# Patient Record
Sex: Female | Born: 1970 | Race: White | Hispanic: No | Smoking: Former smoker
Health system: Southern US, Community
[De-identification: ages and names within clinical notes are randomized; demographics above are authoritative.]

## PROBLEM LIST (undated history)

## (undated) DIAGNOSIS — K900 Celiac disease: Secondary | ICD-10-CM

## (undated) HISTORY — PX: CHOLECYSTECTOMY: SHX55

---

## 1998-05-12 ENCOUNTER — Ambulatory Visit (HOSPITAL_COMMUNITY): Admission: RE | Admit: 1998-05-12 | Discharge: 1998-05-12 | Payer: Self-pay | Admitting: Neurosurgery

## 1998-05-18 ENCOUNTER — Ambulatory Visit (HOSPITAL_COMMUNITY): Admission: RE | Admit: 1998-05-18 | Discharge: 1998-05-18 | Payer: Self-pay | Admitting: Neurosurgery

## 2005-03-22 ENCOUNTER — Emergency Department (HOSPITAL_COMMUNITY): Admission: EM | Admit: 2005-03-22 | Discharge: 2005-03-22 | Payer: Self-pay | Admitting: Emergency Medicine

## 2006-08-23 ENCOUNTER — Inpatient Hospital Stay (HOSPITAL_COMMUNITY): Admission: AD | Admit: 2006-08-23 | Discharge: 2006-08-24 | Payer: Self-pay | Admitting: Obstetrics & Gynecology

## 2006-08-23 ENCOUNTER — Ambulatory Visit: Payer: Self-pay | Admitting: Certified Nurse Midwife

## 2006-08-23 IMAGING — US US OB COMP +14 WK
1 series · 13 of 28 positions shown · non-contrast
Comparison: none

CLINICAL DATA: 35-year-old female with estimated gestation of 33 weeks 6 days by LMP.  No prenatal care and pelvic pain.

[Series 1: us ob comp +14 wk · 0.33mm/px · 13 of 35 slices shown]
[im 2/35]
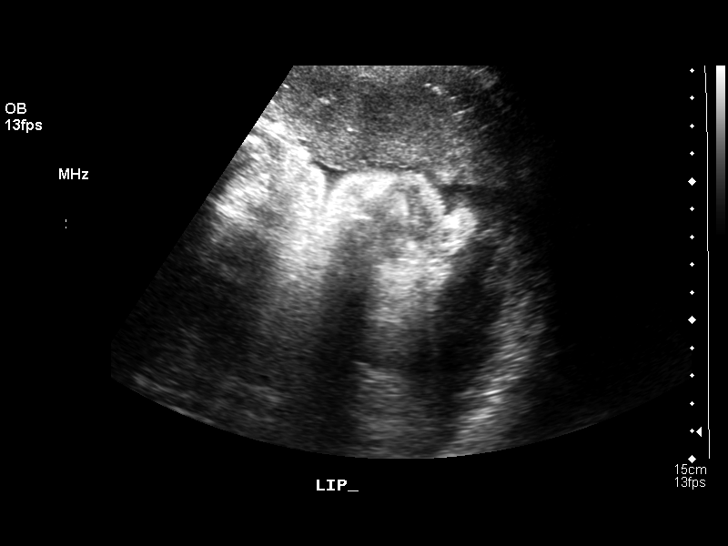
[im 4/35]
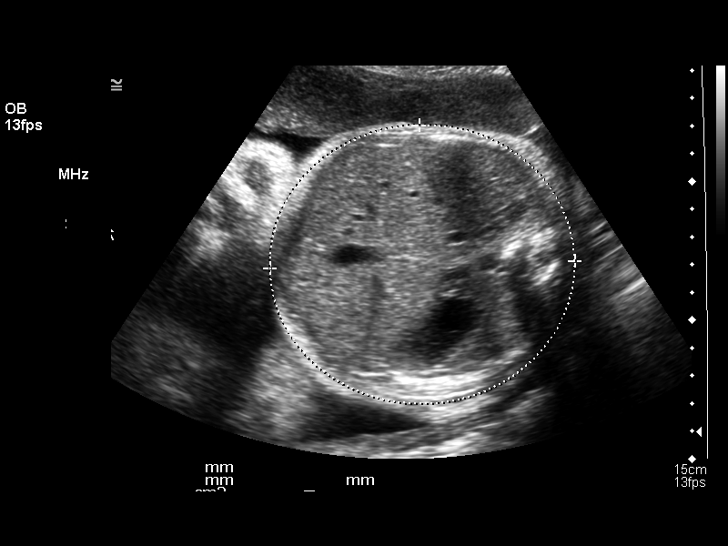
[im 7/35]
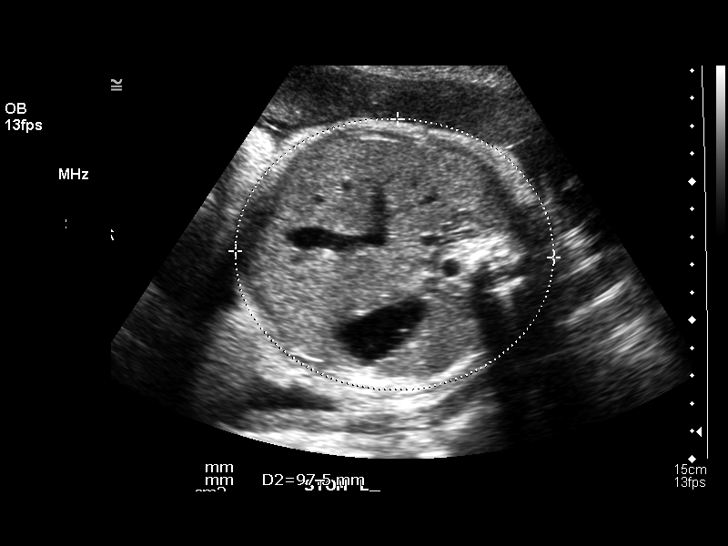
[im 9/35]
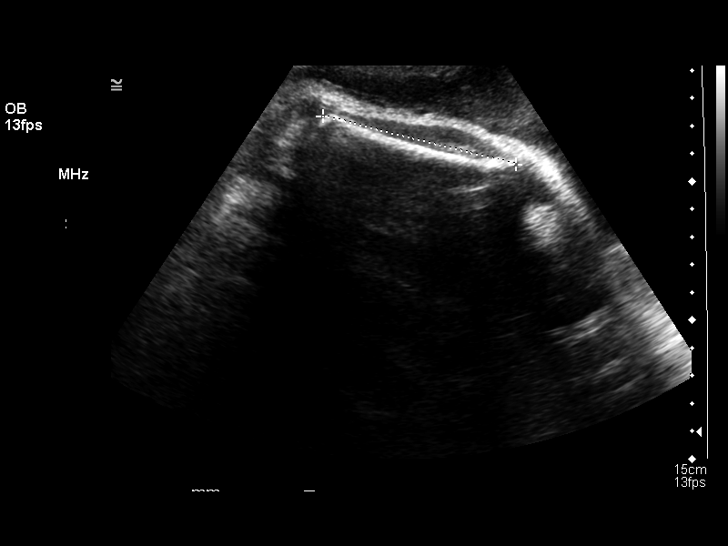
[im 12/35]
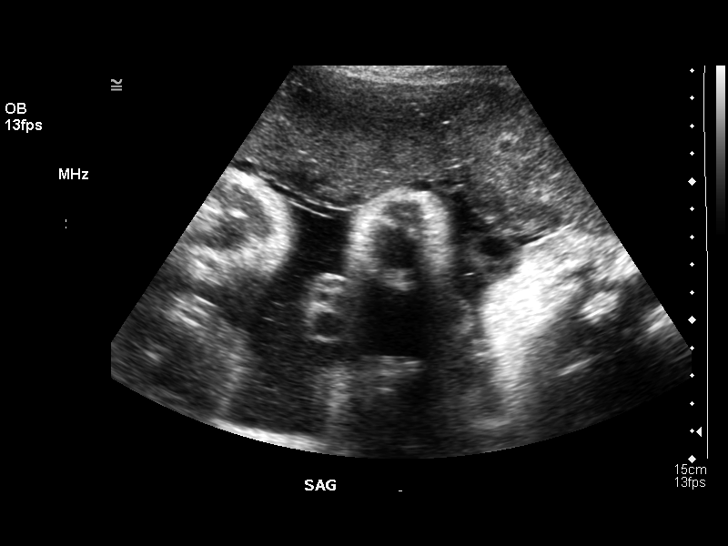
[im 14/35]
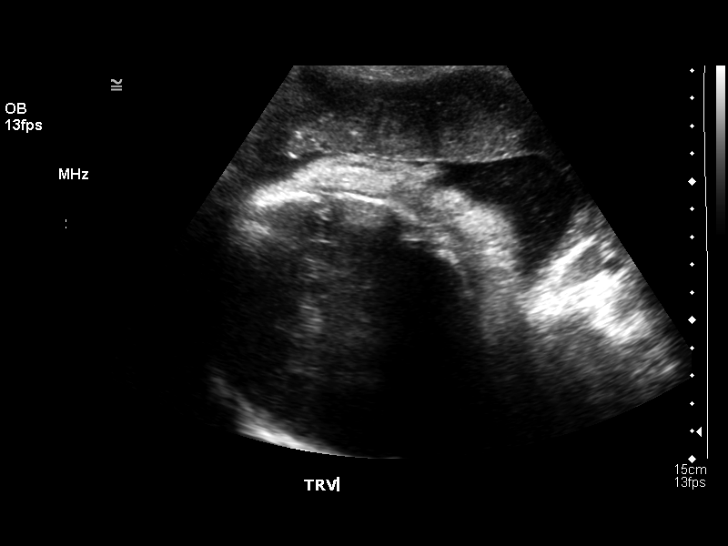
[im 18/35]
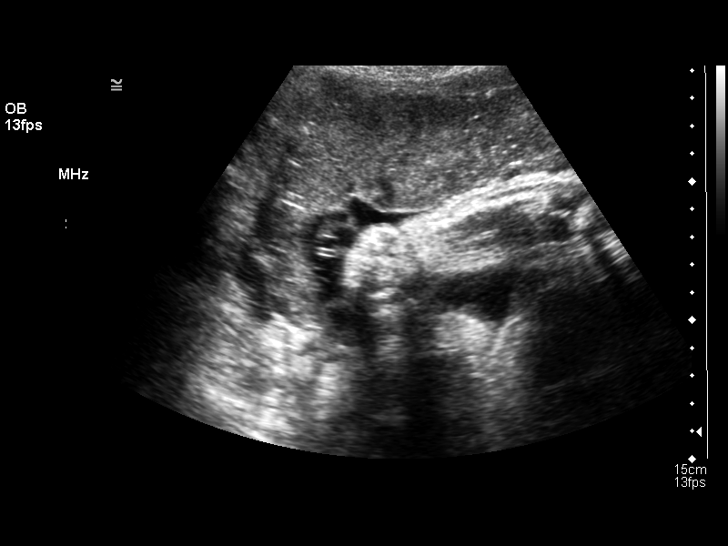
[im 21/35]
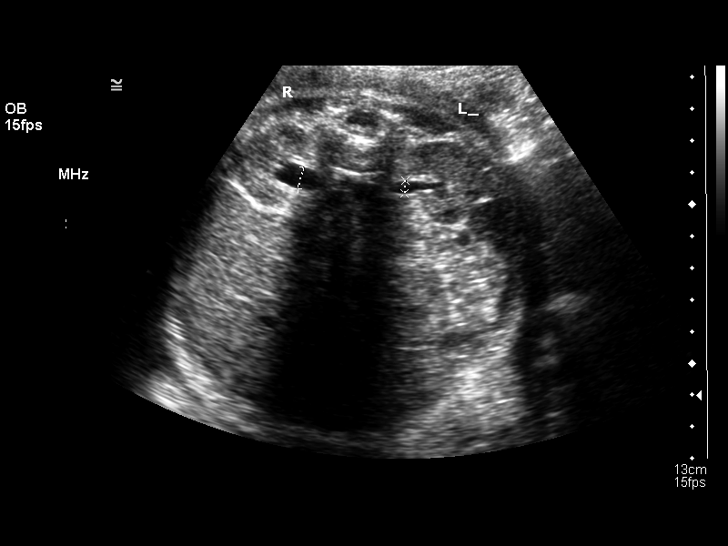
[im 23/35]
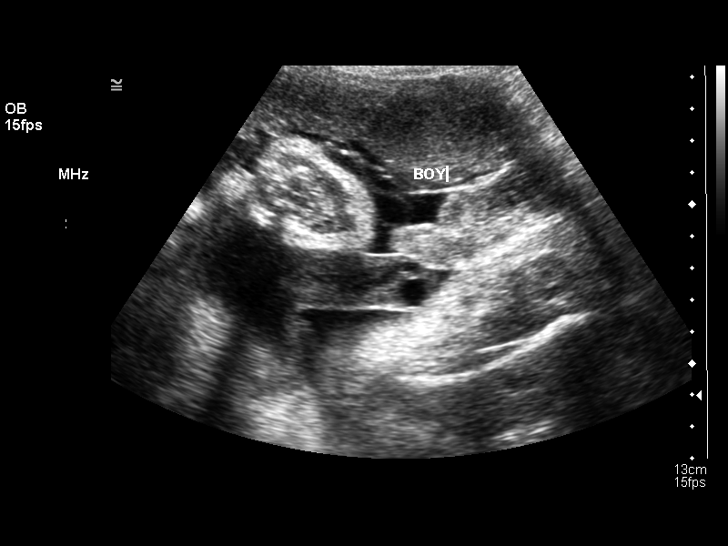
[im 26/35]
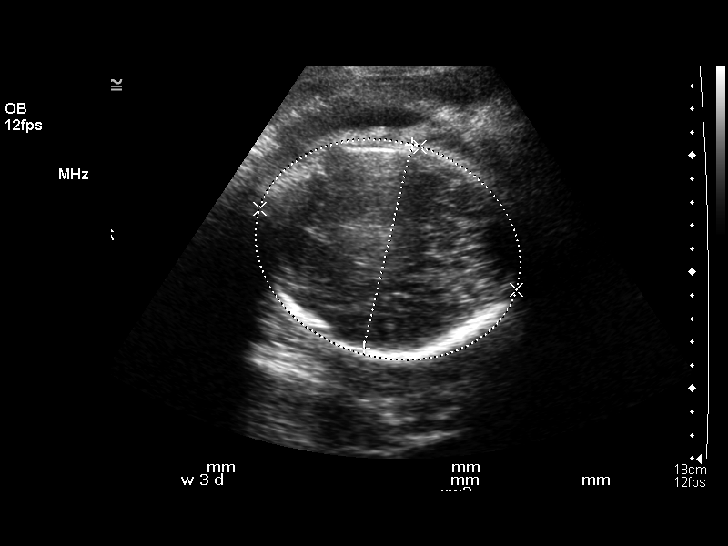
[im 28/35]
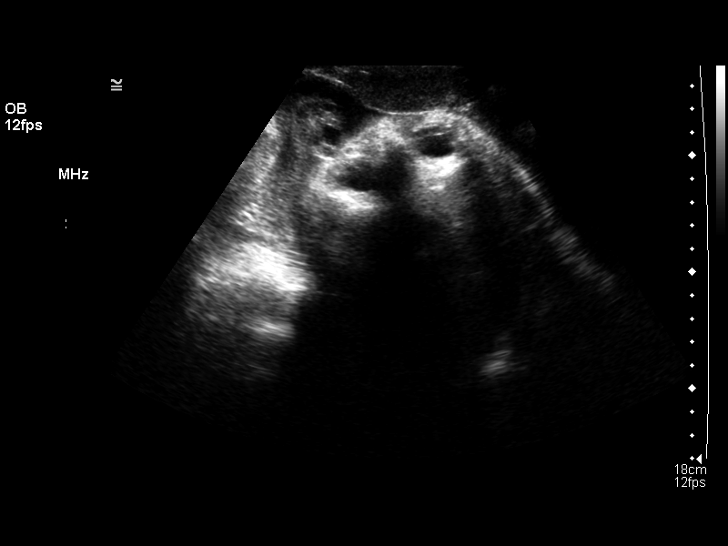
[im 31/35]
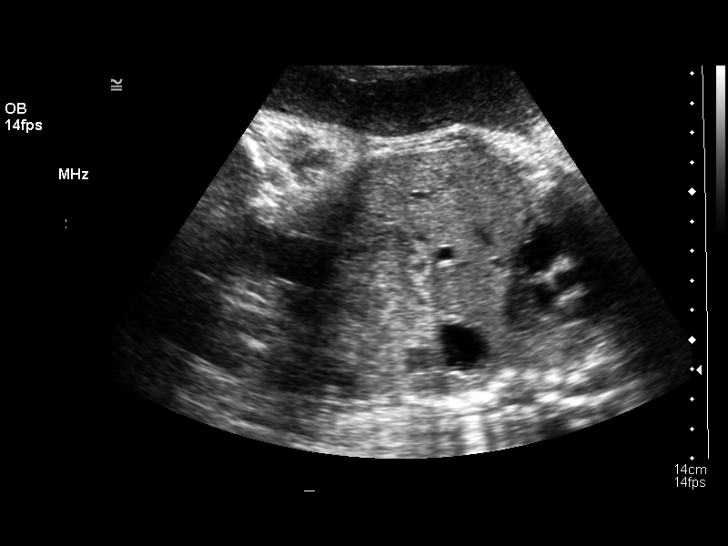
[im 33/35]
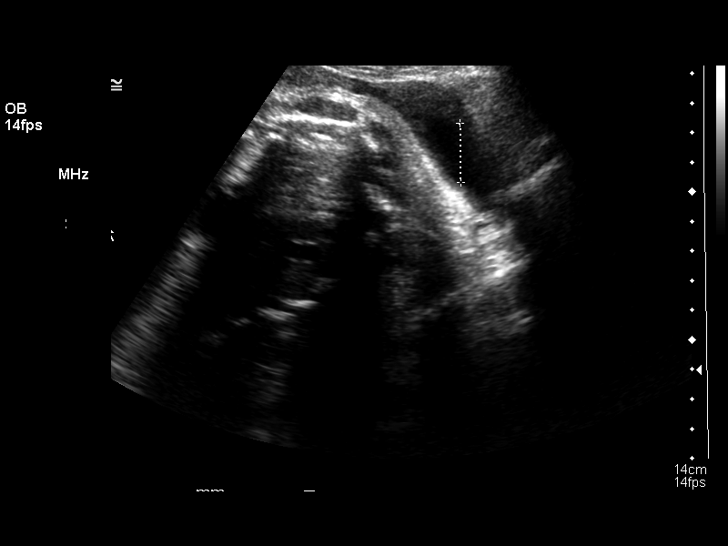

[13 of 28 positions shown; findings below may reference images not displayed]

OBSTETRICAL ULTRASOUND:
 Number of Fetuses:  1
 Heart Rate:  141 bpm
 Movement:  Yes
 Breathing:  No     
 Presentation:  Cephalic
 Placental Location:  Anterior
 Grade:  II
 Previa:  No
 Amniotic Fluid (Subjective):  Normal
 Amniotic Fluid (Objective):   AFI 11.4 cm ([6Q] %ile = 7.5 to 24.4 cm for 37 weeks) 

 FETAL BIOMETRY
 BPD:   9.0 cm    36 w 3 d
 HC:   32.3 cm  37 w 1 d
 AC:  33.5 cm  37 w 3 d
 FL:  7.1 cm  36 w 4 d 

 MEAN GA:  36 w 6 d      US EDC:  [DATE]
 Assigned GA:  33 w 6 d    Assigned EDC: [DATE]

 EFW:  [6Q]   grams 50th 75th %ile ([6Q] ? [6Q] g) for 37 weeks ? ( >95th %ile (95th = [6Q] g) for 34 weeks

 FETAL ANATOMY
 Lateral Ventricles:  Not visualized   
 Thalami/CSP:  Not visualized   
 Posterior Fossa:  Not visualized   
 Nuchal Region:  Not visualized 
 Spine:  Not visualized   
 4 Chamber Heart on Left:  Not visualized   
 Stomach on Left:  Visualized   
 3 Vessel Cord:  Visualized 
 Cord Insertion site:  Not visualized 
 Kidneys:  Visualized   
 Bladder:  Visualized   
 Extremities:  Not visualized   

 ADDITIONAL ANATOMY VISUALIZED:  Upper lip, orbits, and diaphragm.

 Evaluation limited by:  Fetal position and advanced gestational age.

 MATERNAL UTERINE AND ADNEXAL FINDINGS
 Cervix: Not evaluated; >34 weeks.
IMPRESSION: 1.   Single living intrauterine gestation in cephalic presentation with estimated gestational age by this ultrasound of 36 weeks 6 days.
 2.  Right renal pelvis measuring 7 mm and left renal pelvis 4 mm.  Follow-up recommended.
 3.  Normal amniotic fluid volume.

## 2006-08-27 ENCOUNTER — Inpatient Hospital Stay (HOSPITAL_COMMUNITY): Admission: AD | Admit: 2006-08-27 | Discharge: 2006-08-28 | Payer: Self-pay | Admitting: Obstetrics and Gynecology

## 2006-08-30 ENCOUNTER — Encounter (INDEPENDENT_AMBULATORY_CARE_PROVIDER_SITE_OTHER): Payer: Self-pay | Admitting: *Deleted

## 2006-08-30 ENCOUNTER — Ambulatory Visit: Payer: Self-pay | Admitting: *Deleted

## 2006-08-30 ENCOUNTER — Inpatient Hospital Stay (HOSPITAL_COMMUNITY): Admission: AD | Admit: 2006-08-30 | Discharge: 2006-08-30 | Payer: Self-pay | Admitting: Gynecology

## 2006-09-01 ENCOUNTER — Inpatient Hospital Stay (HOSPITAL_COMMUNITY): Admission: AD | Admit: 2006-09-01 | Discharge: 2006-09-01 | Payer: Self-pay | Admitting: Obstetrics and Gynecology

## 2006-09-01 ENCOUNTER — Ambulatory Visit: Payer: Self-pay | Admitting: Obstetrics and Gynecology

## 2006-09-04 ENCOUNTER — Inpatient Hospital Stay (HOSPITAL_COMMUNITY): Admission: AD | Admit: 2006-09-04 | Discharge: 2006-09-04 | Payer: Self-pay | Admitting: Obstetrics & Gynecology

## 2006-09-04 ENCOUNTER — Ambulatory Visit: Payer: Self-pay | Admitting: Obstetrics & Gynecology

## 2006-09-05 ENCOUNTER — Inpatient Hospital Stay (HOSPITAL_COMMUNITY): Admission: AD | Admit: 2006-09-05 | Discharge: 2006-09-07 | Payer: Self-pay | Admitting: Obstetrics and Gynecology

## 2006-09-05 ENCOUNTER — Ambulatory Visit: Payer: Self-pay | Admitting: *Deleted

## 2009-08-26 ENCOUNTER — Emergency Department (HOSPITAL_COMMUNITY): Admission: EM | Admit: 2009-08-26 | Discharge: 2009-08-26 | Payer: Self-pay | Admitting: Emergency Medicine

## 2009-08-26 IMAGING — CR DG TOE 3RD 2+V*R*
3 series · 3 of 3 positions shown · non-contrast
Comparison: None

CLINICAL DATA: The facial injury.  Fall in gravel.  Shoulder and
foot pain.

RIGHT TOE - 2+ VIEW

[t toes ap right]
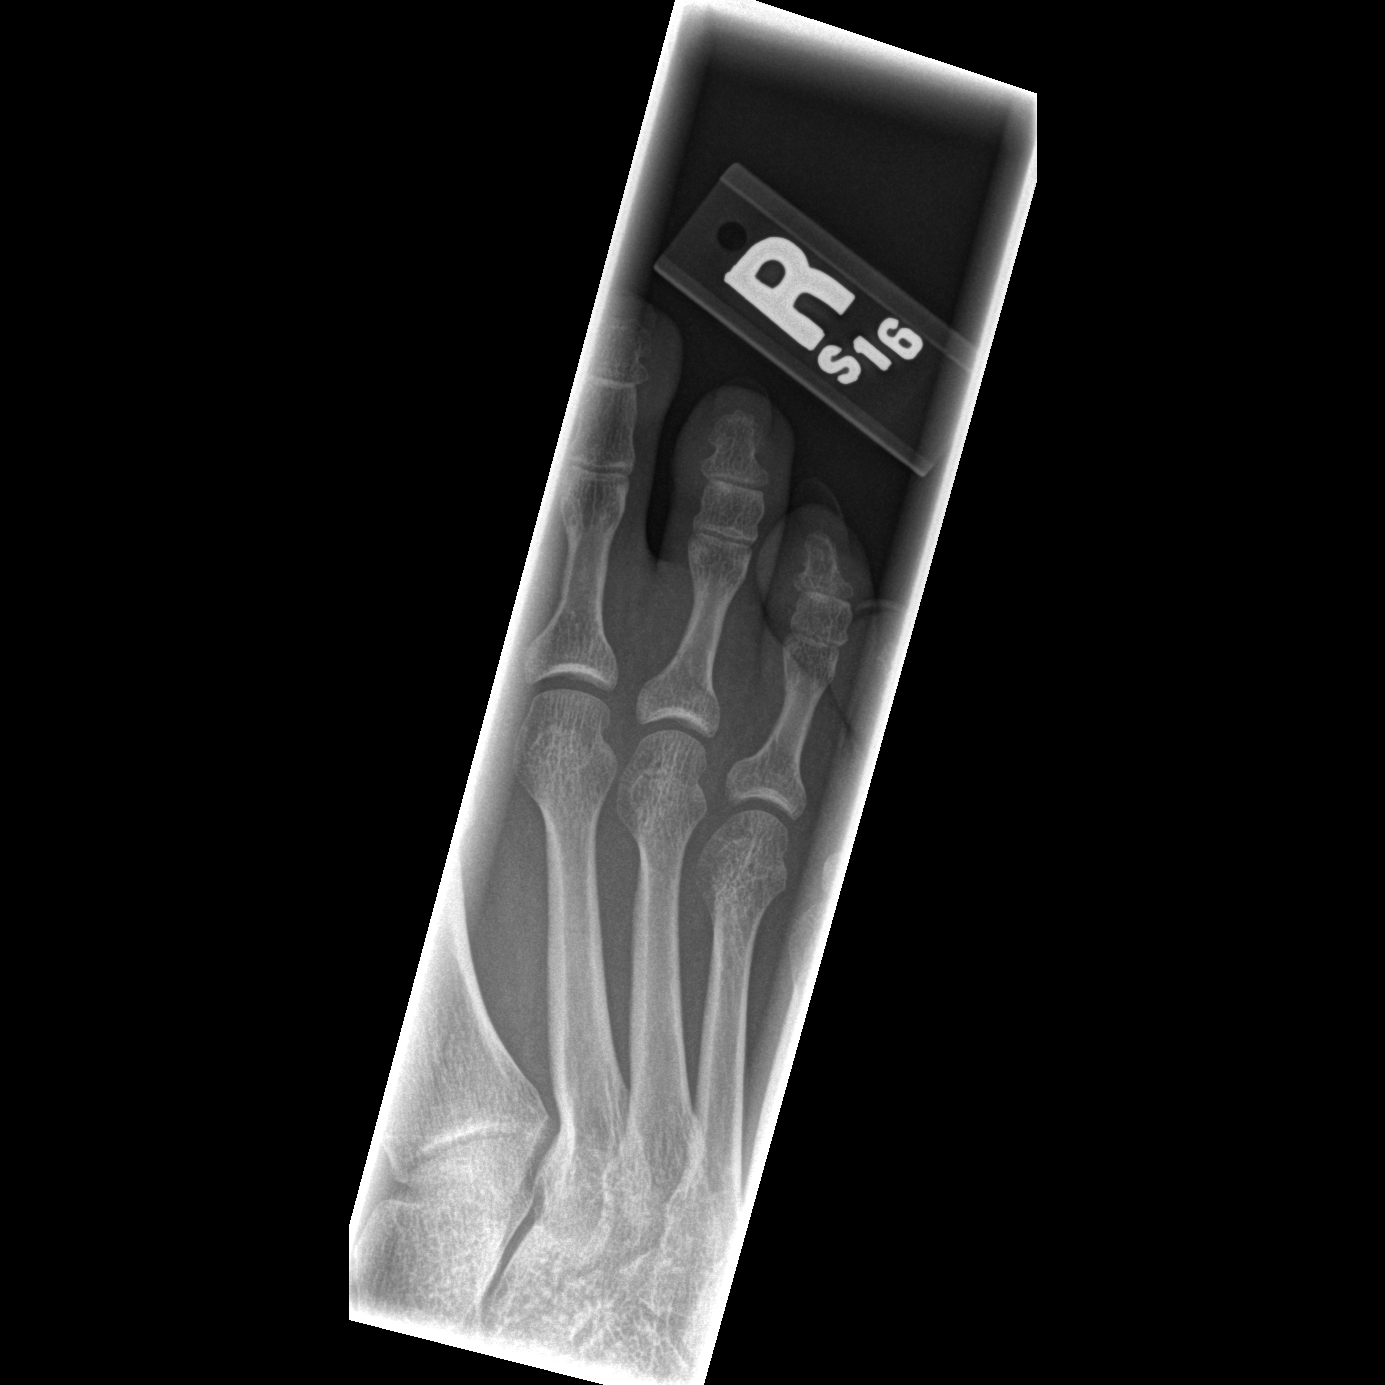

[t toes oblique right]
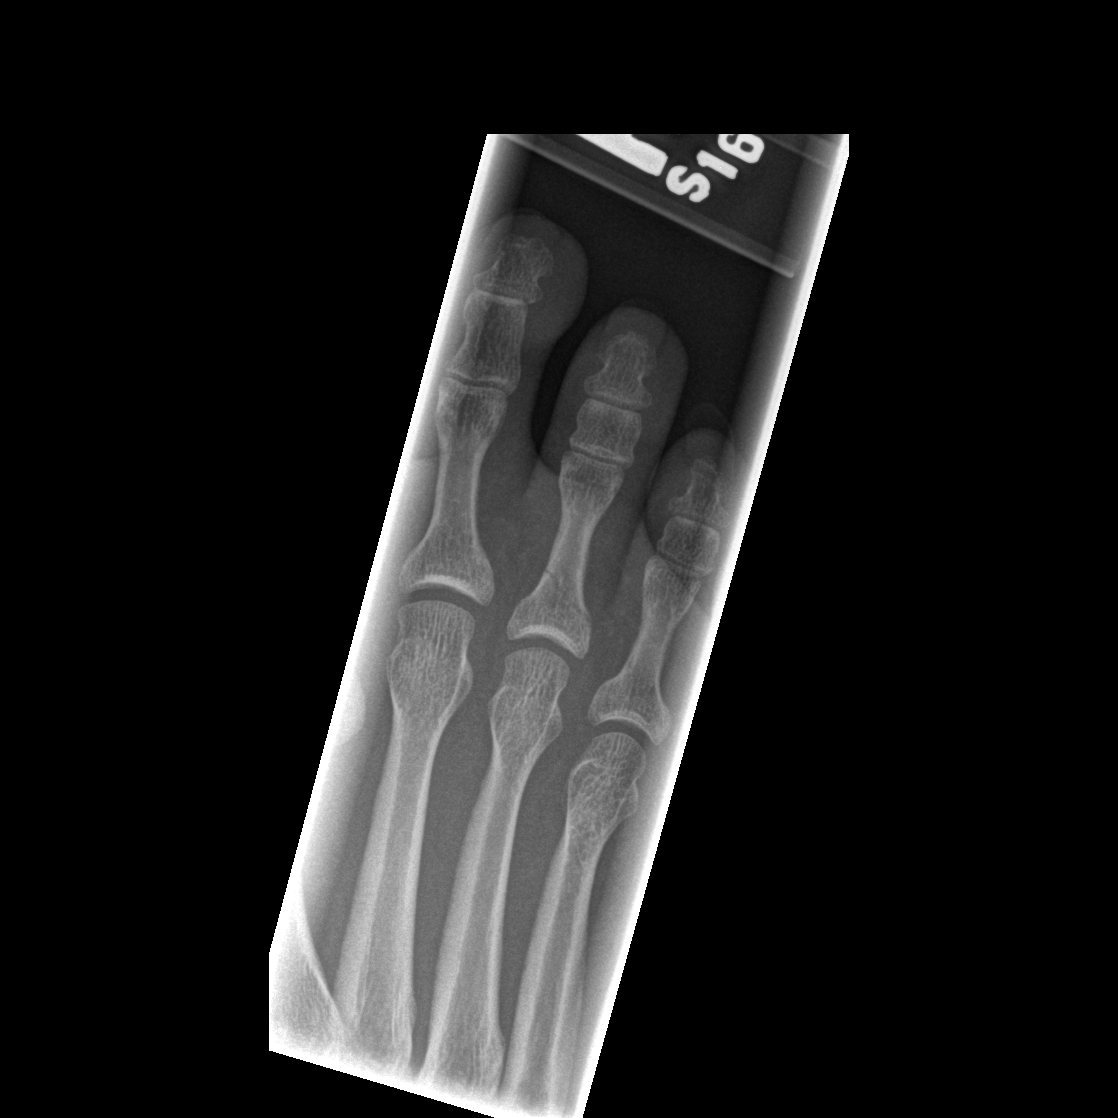

[t toes lateral right]
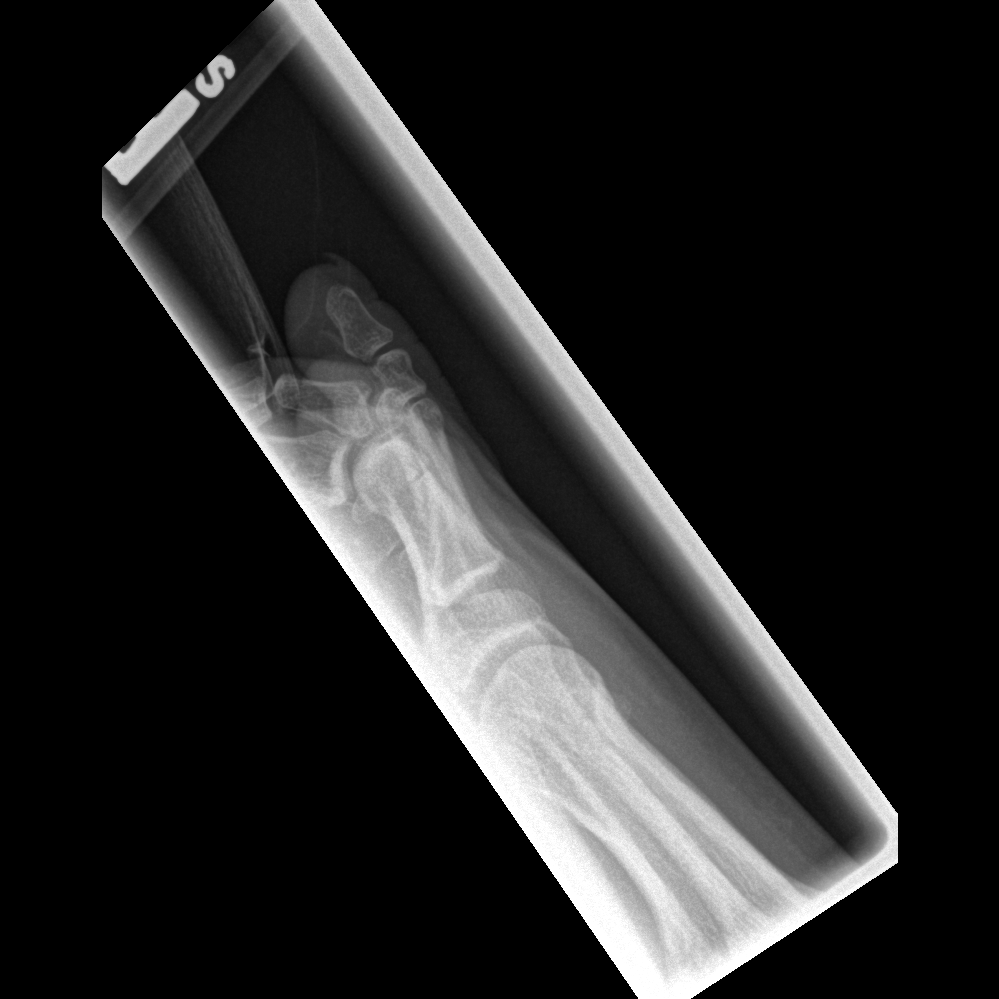

[3 of 3 positions shown; findings below may reference images not displayed]

FINDINGS: Three views of the right third toe demonstrate a
nondisplaced transverse fracture of the proximal metaphysis, with a
slightly oblique component.
IMPRESSION: 1.  Nondisplaced fracture with transverse and oblique components in
the proximal metaphysis of the proximal phalanx of the third toe on
the right.

## 2009-08-26 IMAGING — CT CT HEAD W/O CM
1 series · 16 of 30 positions shown, 20 images · non-contrast
Comparison: None.

CLINICAL DATA: Headache, syncope, trauma

CT HEAD WITHOUT CONTRAST
TECHNIQUE: Contiguous axial images were obtained from the base of
the skull through the vertex without contrast

[Series 2: head_seq 4.5 h37s st · axial · 0.43mm/px · z∈[+1129,+1273]mm · 16 of 36 slices shown, 20 images]
[im 2/36  brain]
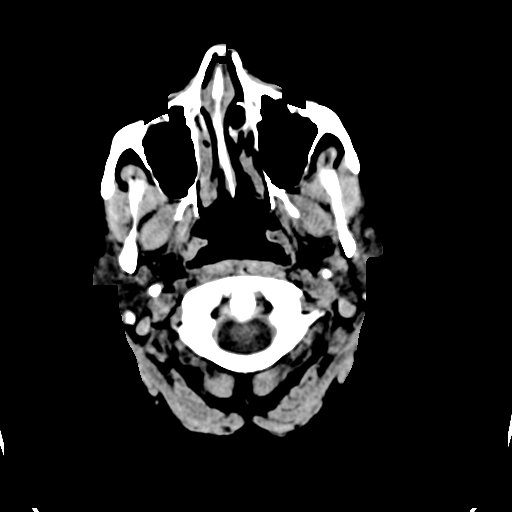
[im 2/36  bone]
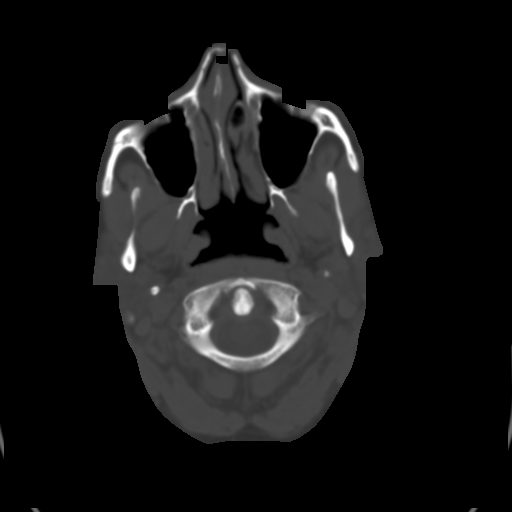
[im 4/36  brain]
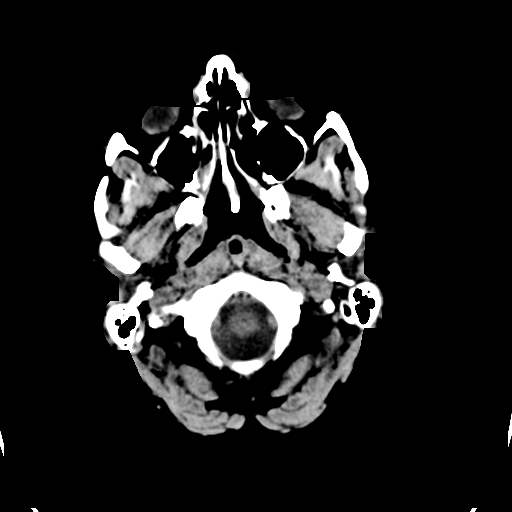
[im 7/36  brain]
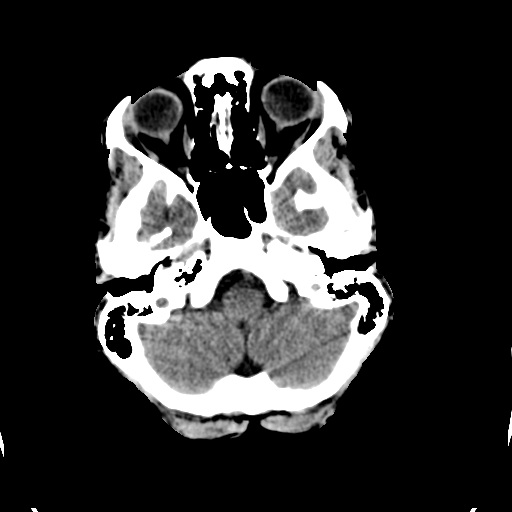
[im 9/36  brain]
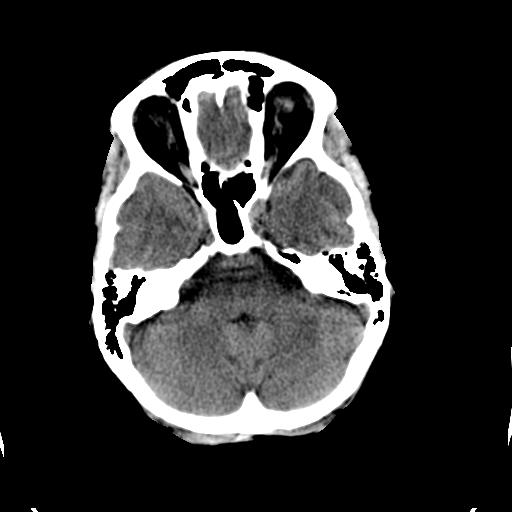
[im 10/36  brain]
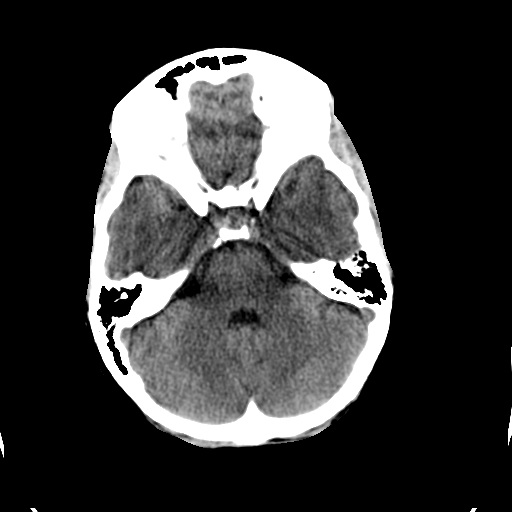
[im 10/36  bone]
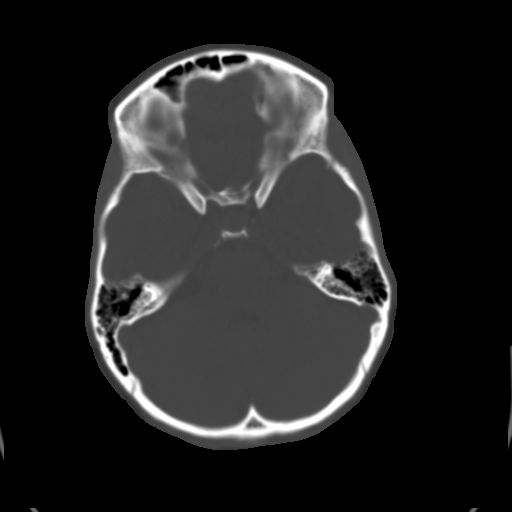
[im 13/36  brain]
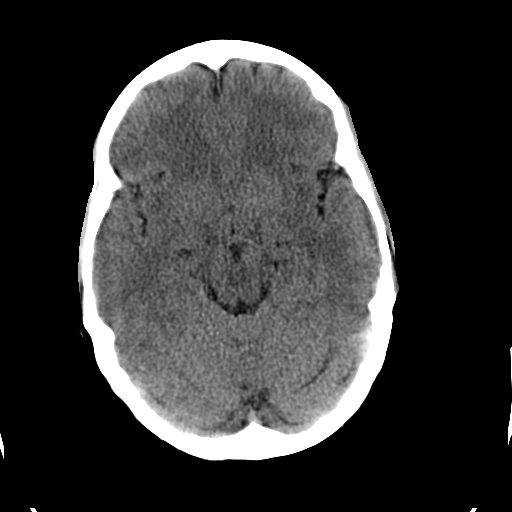
[im 15/36  brain]
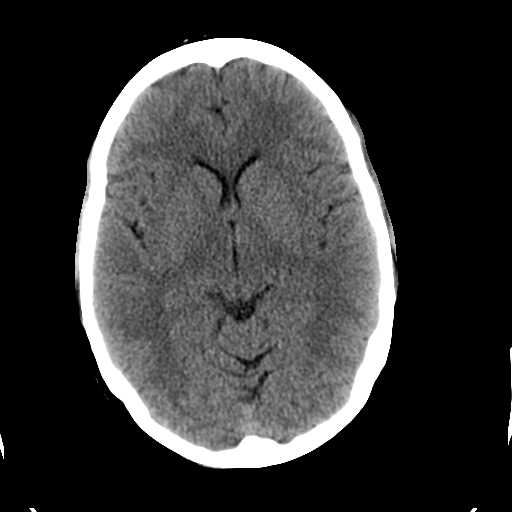
[im 17/36  brain]
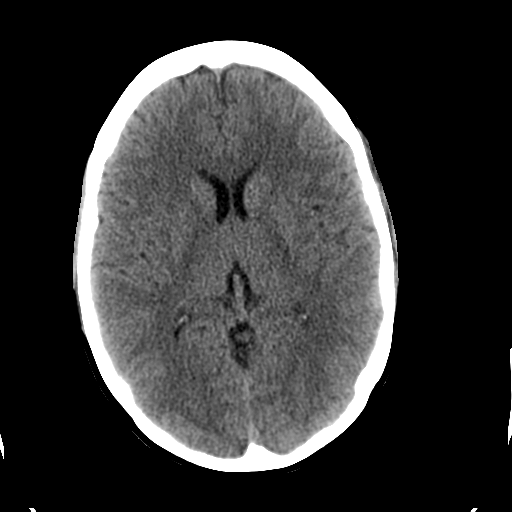
[im 19/36  brain]
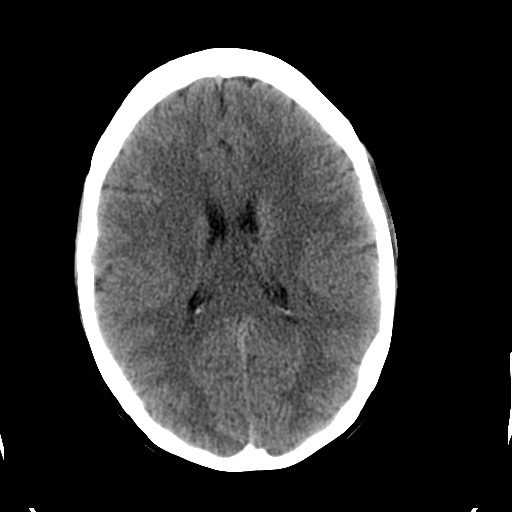
[im 19/36  bone]
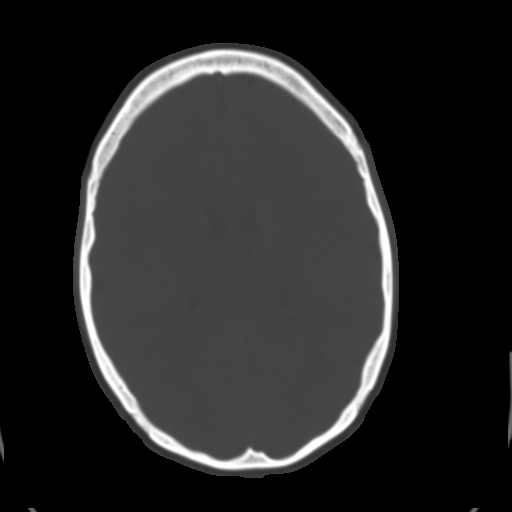
[im 21/36  brain]
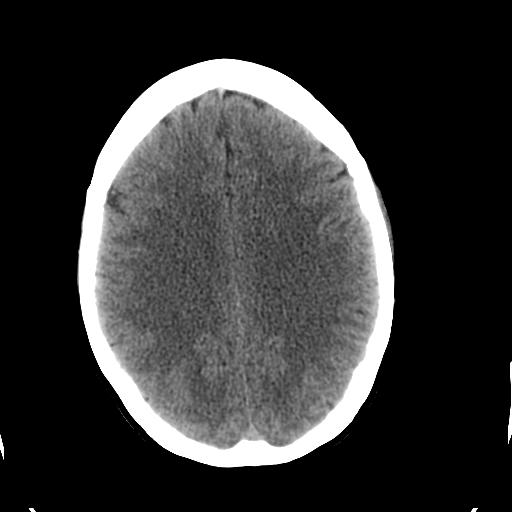
[im 23/36  brain]
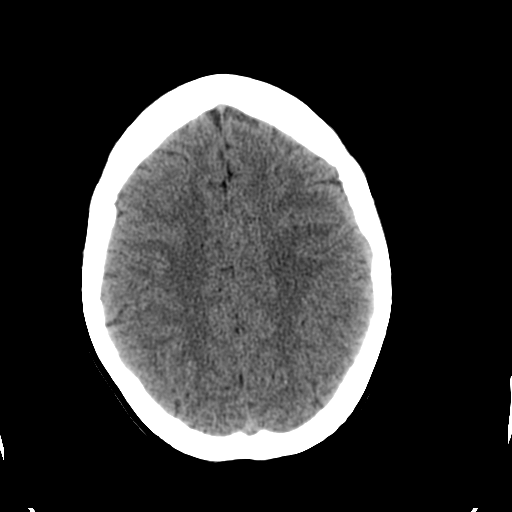
[im 26/36  brain]
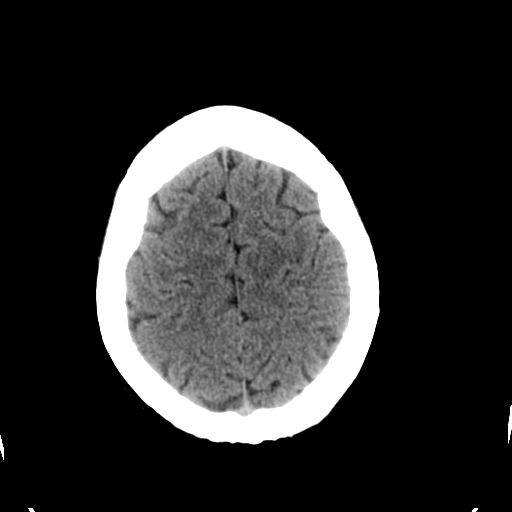
[im 27/36  brain]
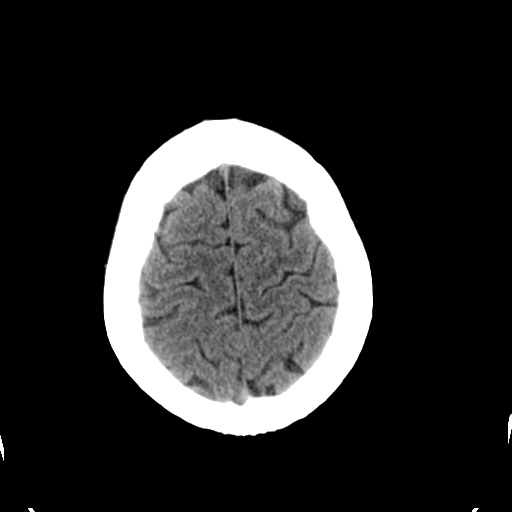
[im 27/36  bone]
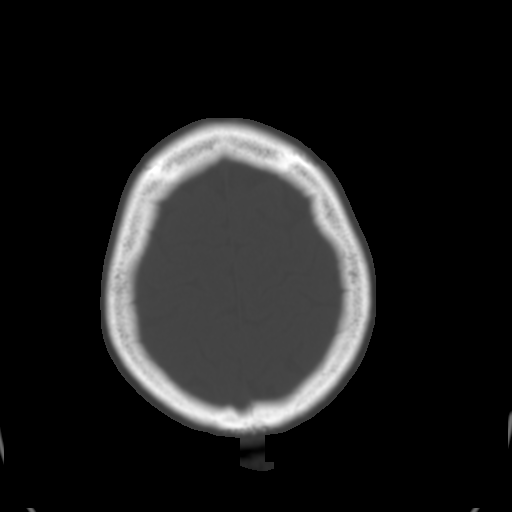
[im 29/36  brain]
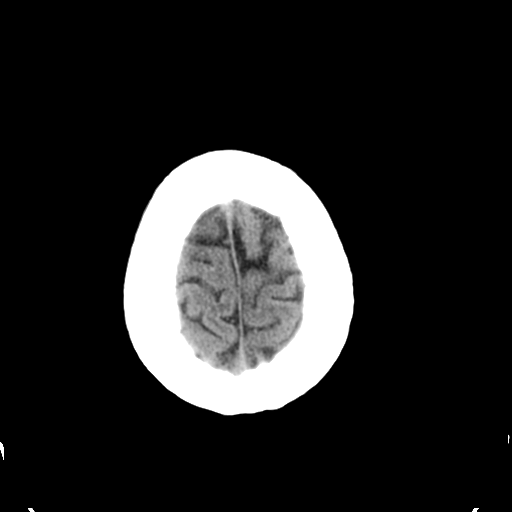
[im 32/36  brain]
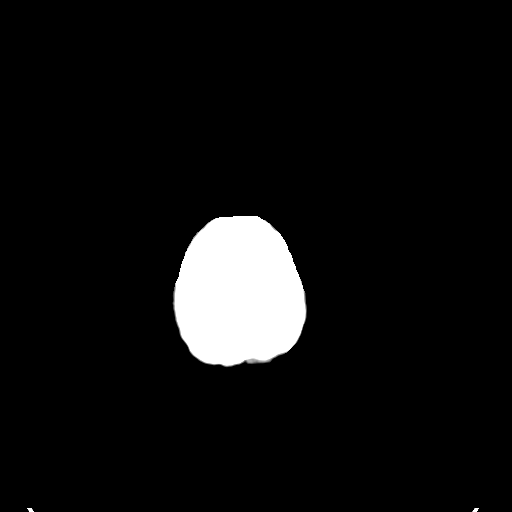
[im 34/36  brain]
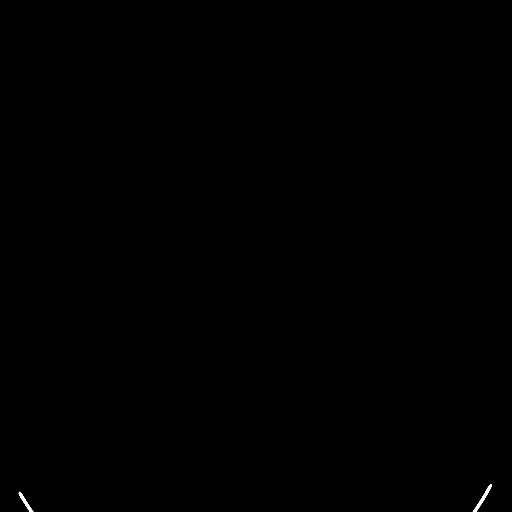

[16 of 30 positions shown; findings below may reference images not displayed]

FINDINGS: The brain has a normal appearance without evidence for
hemorrhage, acute infarction, hydrocephalus, or mass lesion.  There
is no extra axial fluid collection.  The skull and paranasal
sinuses are normal.
IMPRESSION: Normal CT of the head without contrast.

## 2009-08-26 IMAGING — CR DG CERVICAL SPINE COMPLETE 4+V
6 series · 6 of 6 positions shown · non-contrast
Comparison: The none

CLINICAL DATA: Fall in gravel.  Neck pain.

CERVICAL SPINE - COMPLETE 4+ VIEW

[w c-spine lat]
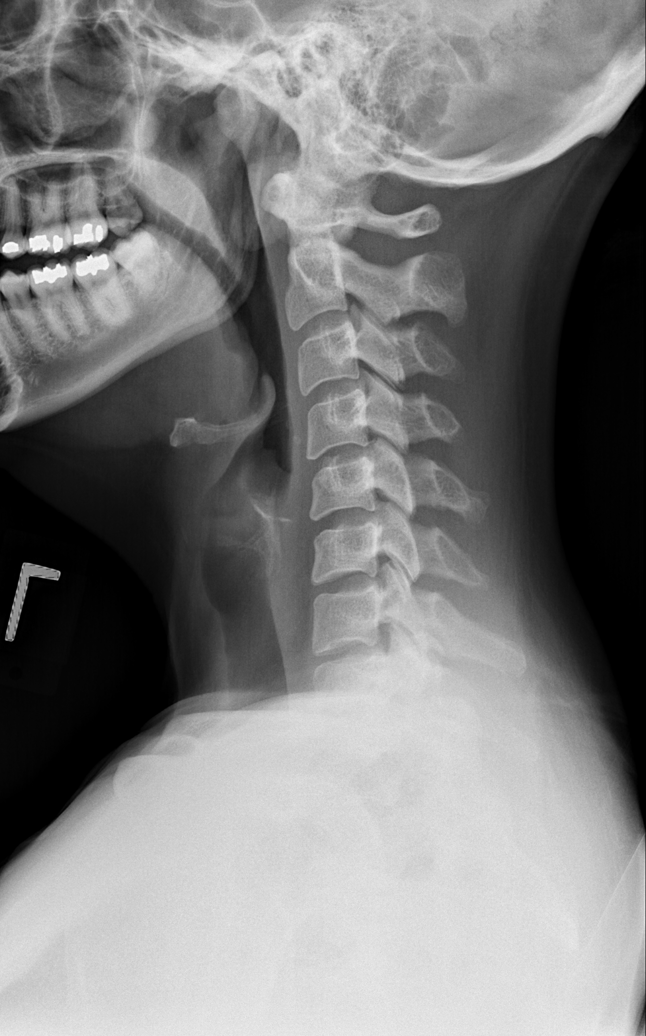

[t c-spine a.p.]
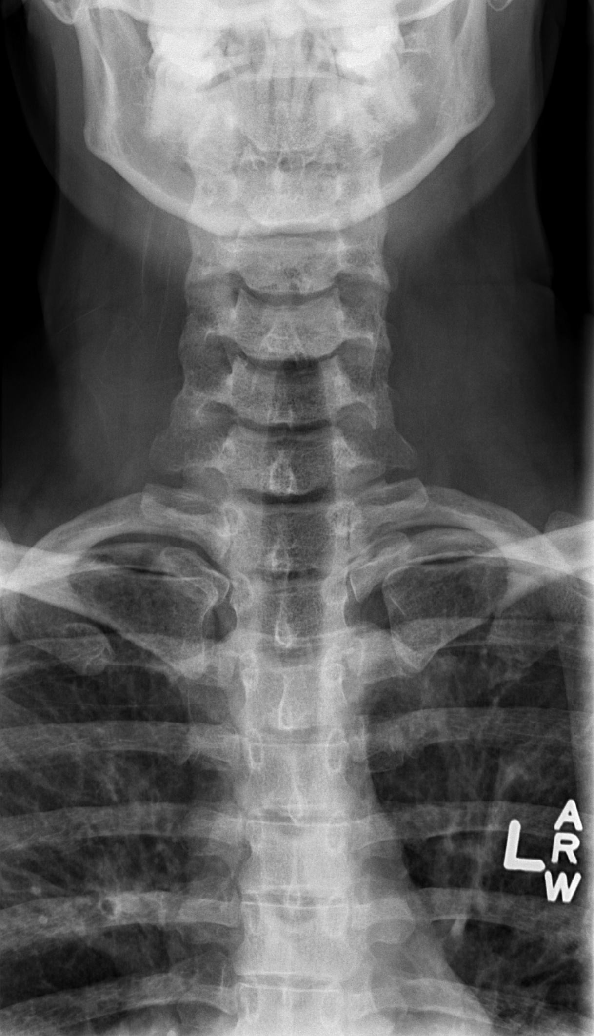

[t swimmers]
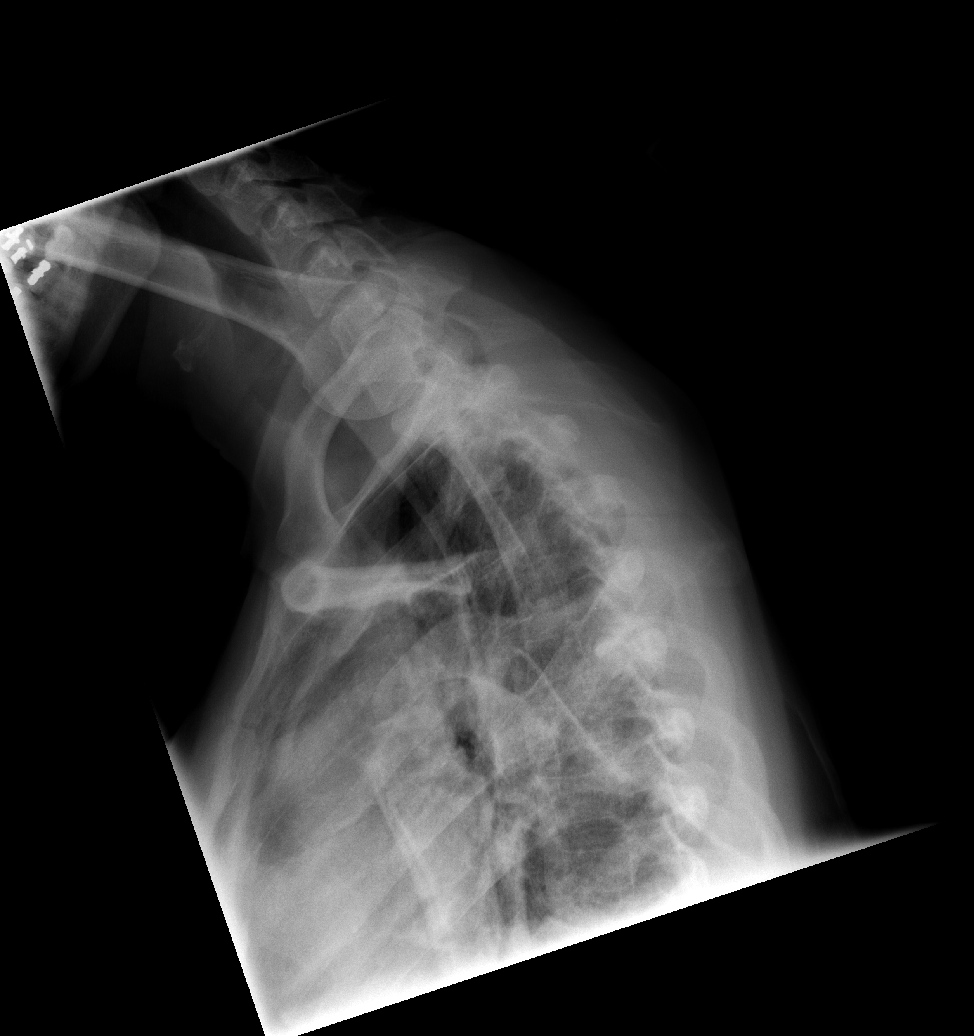

[w c-spine oblique (1 of 2)]
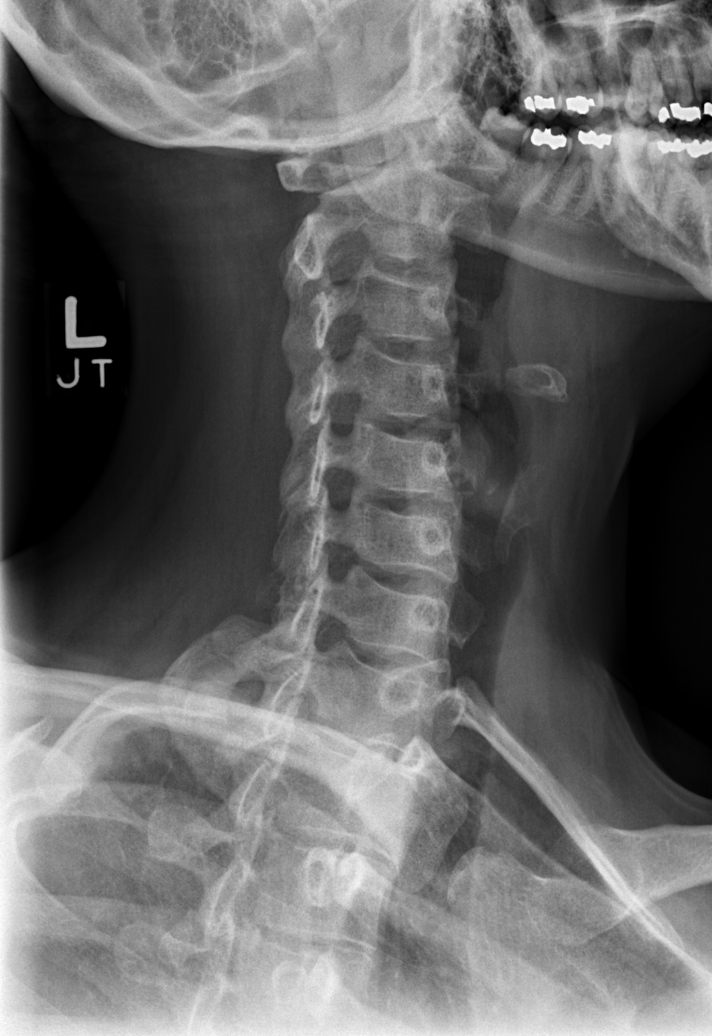

[w c-spine oblique (2 of 2)]
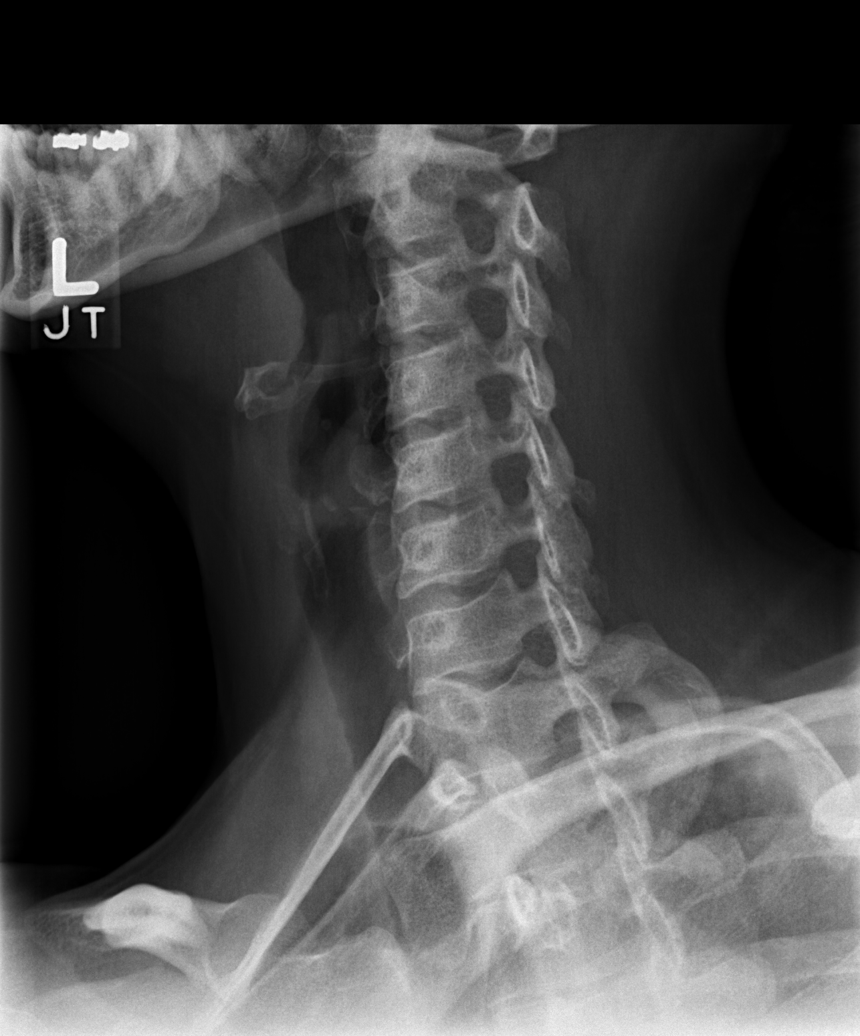

[w c-spine odontoid *]
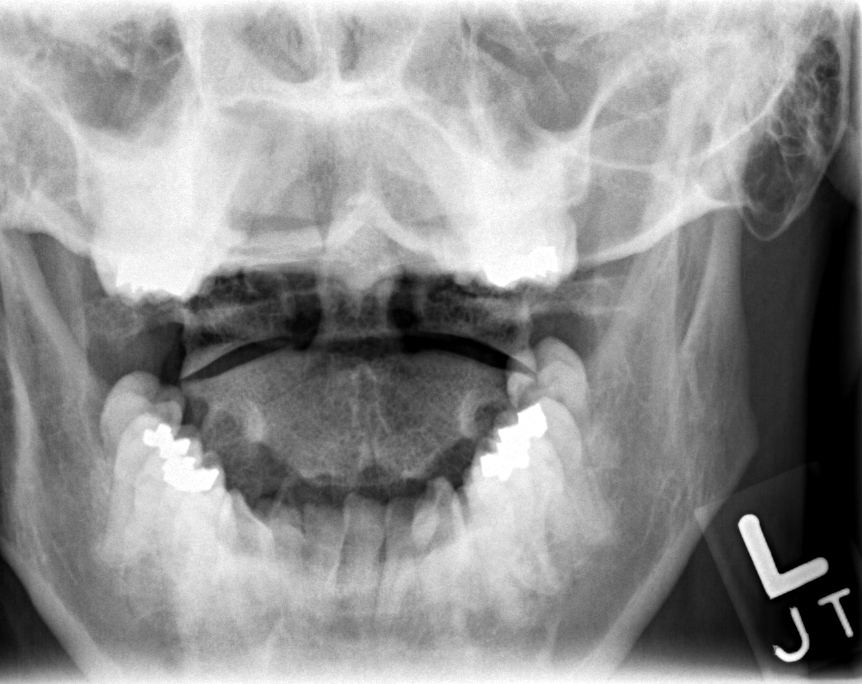

[6 of 6 positions shown; findings below may reference images not displayed]

FINDINGS: No prevertebral soft tissue swelling identified.  No
subluxation is evident.

No cortical discontinuity is identified to suggest cervical spine
fracture.

There is mild reversal of the normal cervical lordosis.

A very faint linear lucency projecting over the right C2 body on
the odontoid view is thought to likely be artifactual.
IMPRESSION: 1.  No specific acute findings. Please note that in the setting of
significant trauma, some authorities recommend cervical spine CT as
the diagnostic exam of choice due to higher sensitivity for
fracture.  If the patient's physical exam of the cervical spine is
abnormal then CT would be recommended.

## 2009-08-26 IMAGING — CR DG THORACIC SPINE 2V
3 series · 3 of 3 positions shown · non-contrast
Comparison: None

CLINICAL DATA: Fall in gravel.  Back pain.

THORACIC SPINE - 2 VIEW

[t t-spine a.p.]
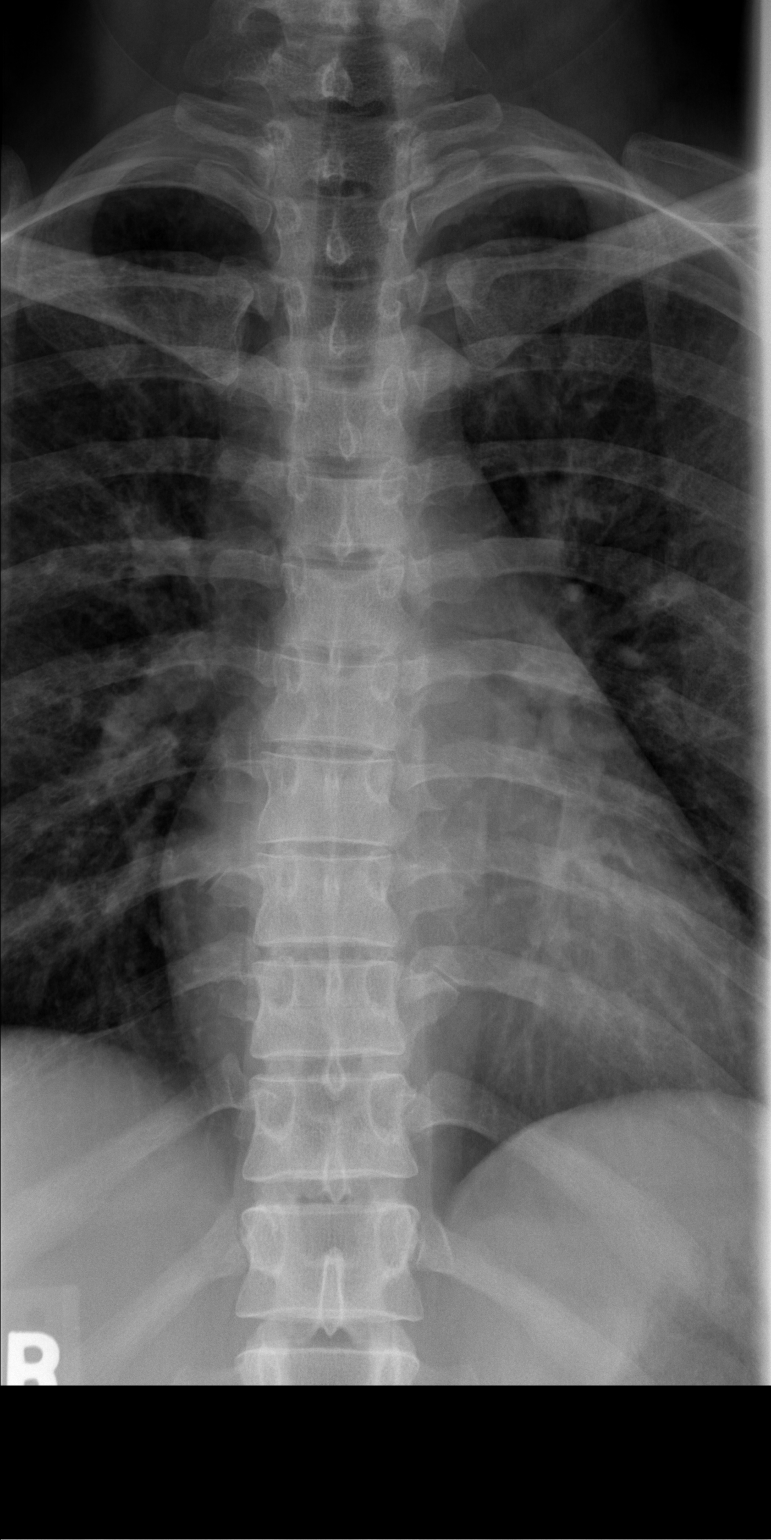

[t t-spine lat (1 of 2)]
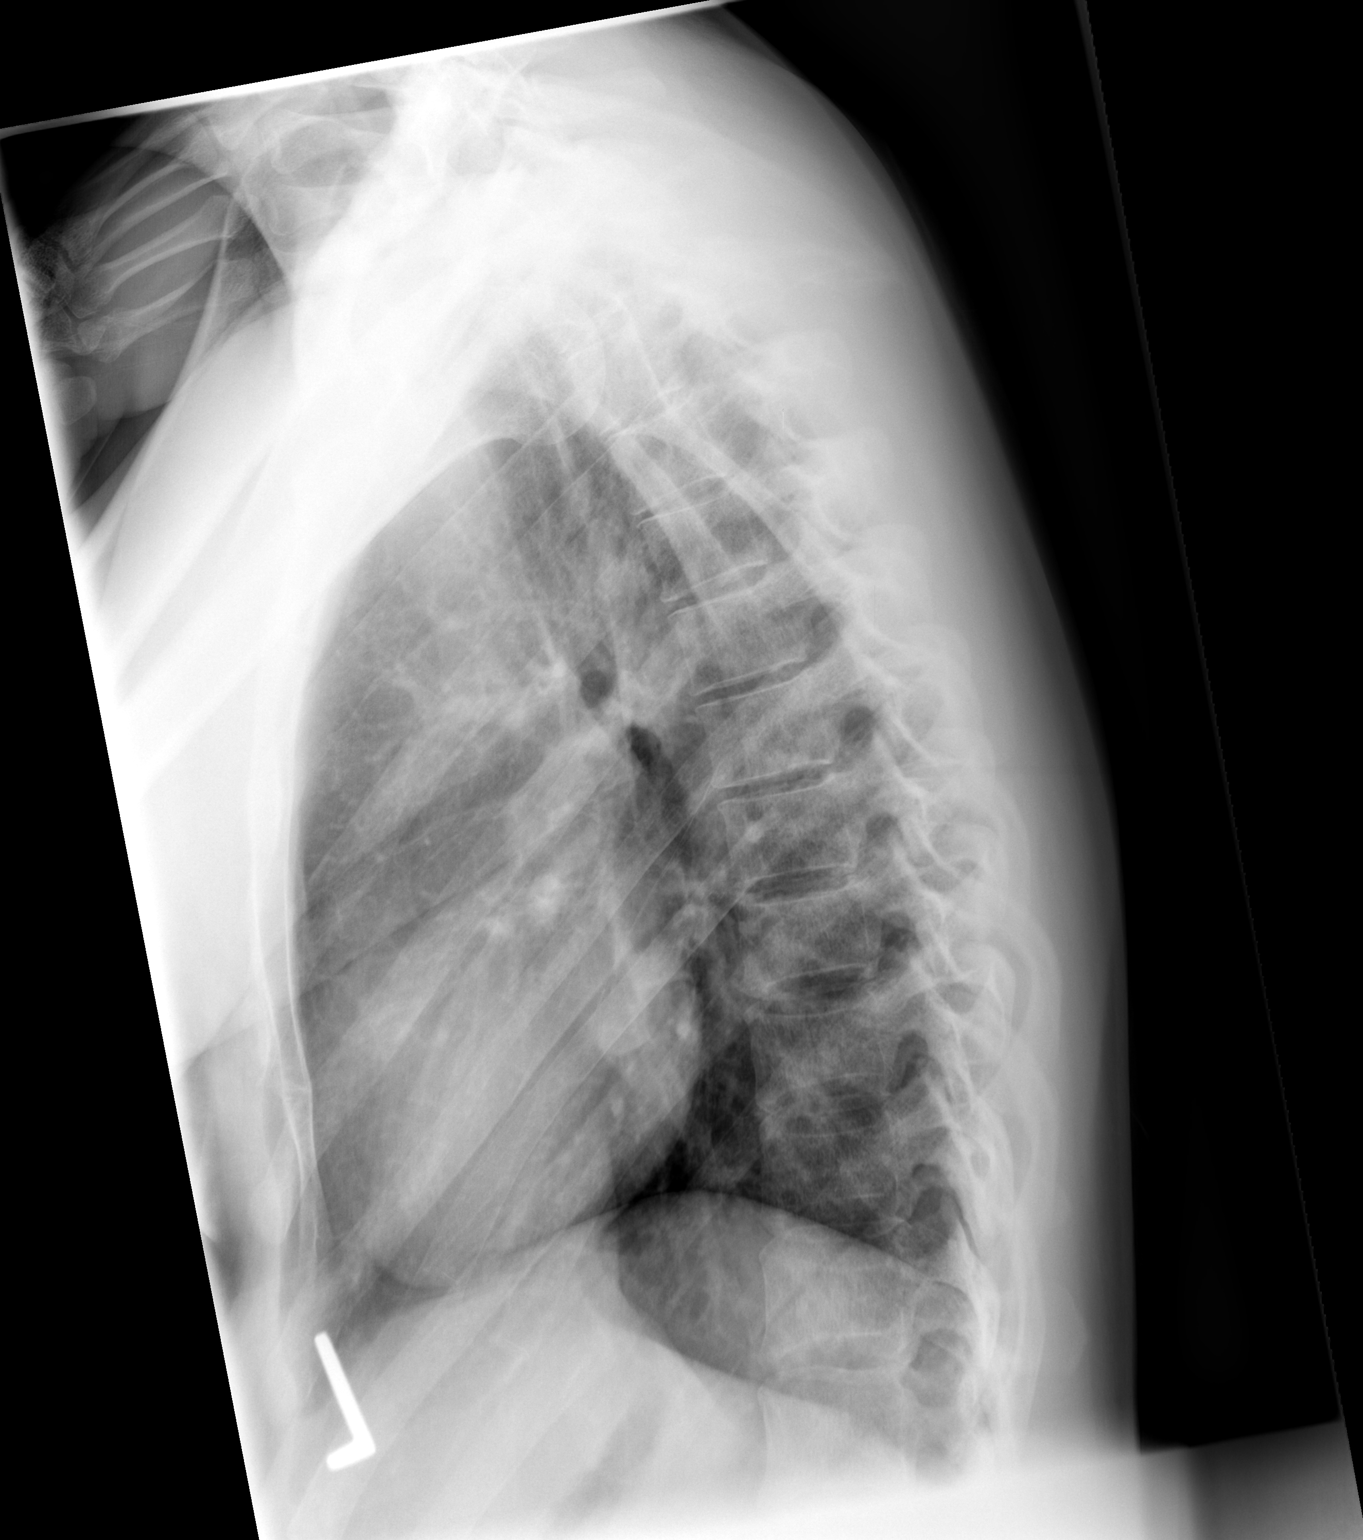

[t t-spine lat (2 of 2)]
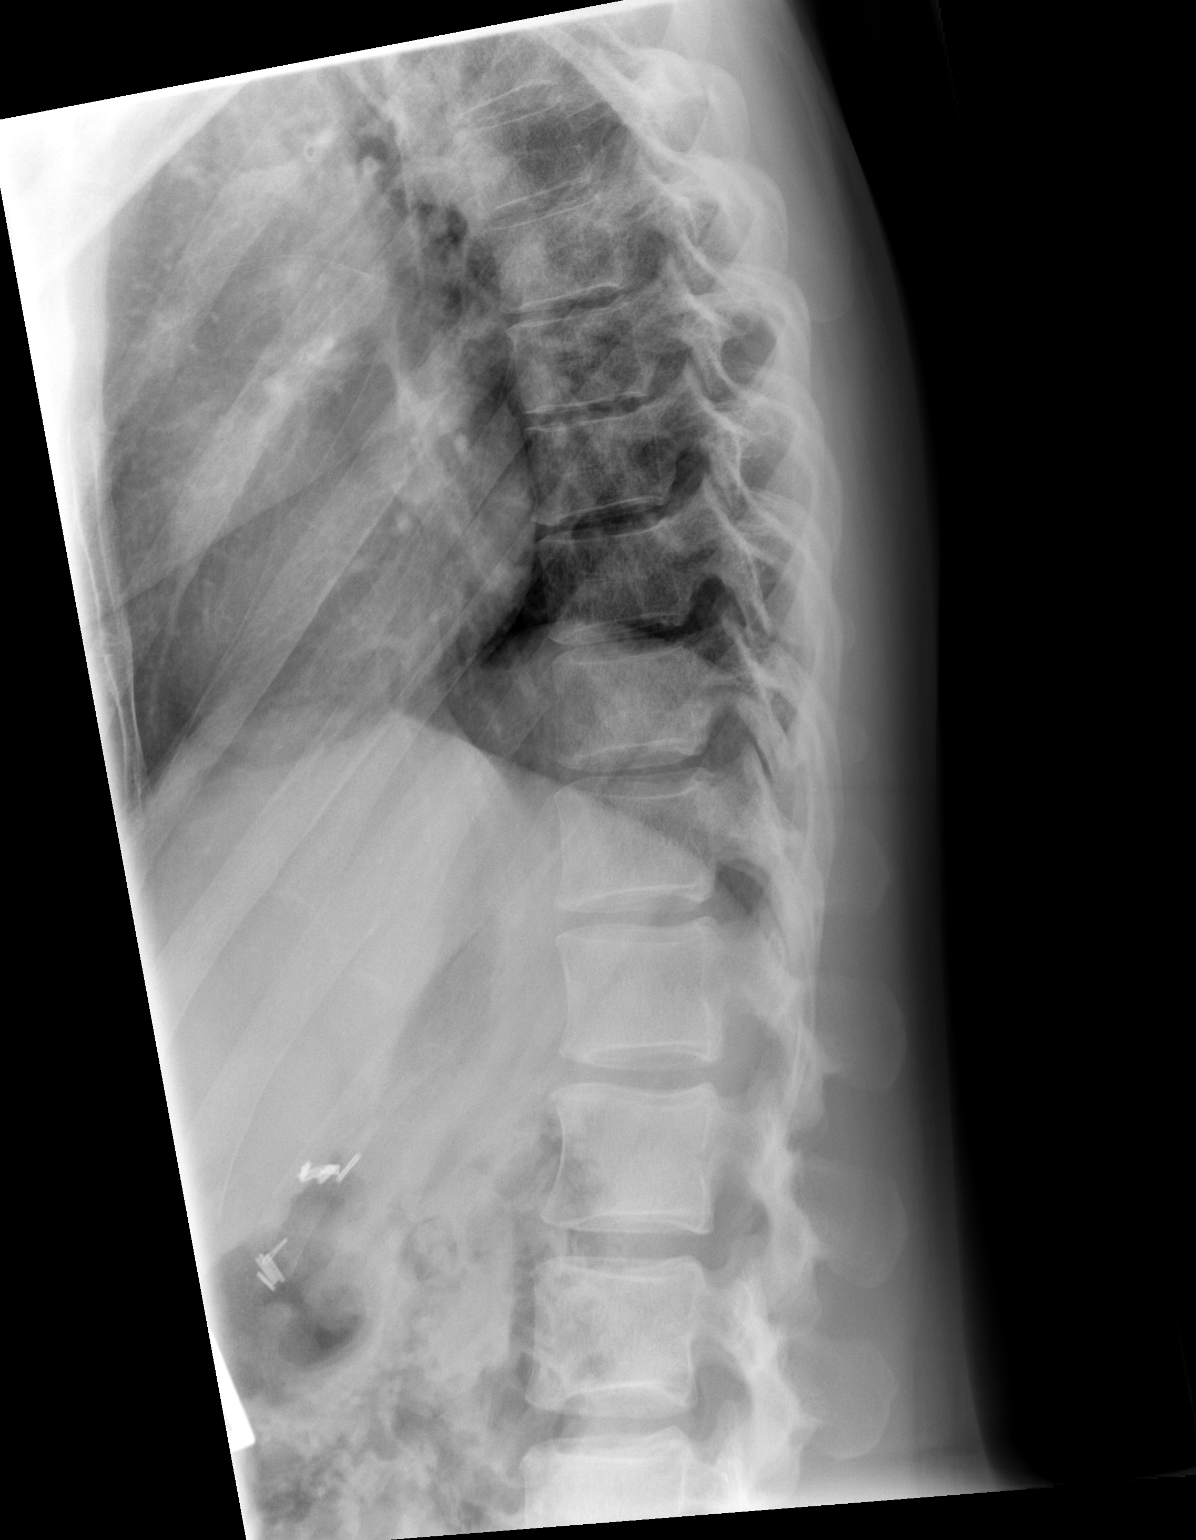

[3 of 3 positions shown; findings below may reference images not displayed]

FINDINGS: Normal vertebral alignment is maintained, without
significant subluxation.

No thoracic spine fracture identified.  No acute bony findings are
noted.
IMPRESSION: 1.  No fracture or subluxation identified.

## 2009-08-26 IMAGING — CR DG SHOULDER 2+V*L*
3 series · 3 of 3 positions shown · non-contrast
Comparison: None

CLINICAL DATA: Fall in gravel.  Left shoulder pain.

LEFT SHOULDER - 2+ VIEW

[t shoulder ap internal left]
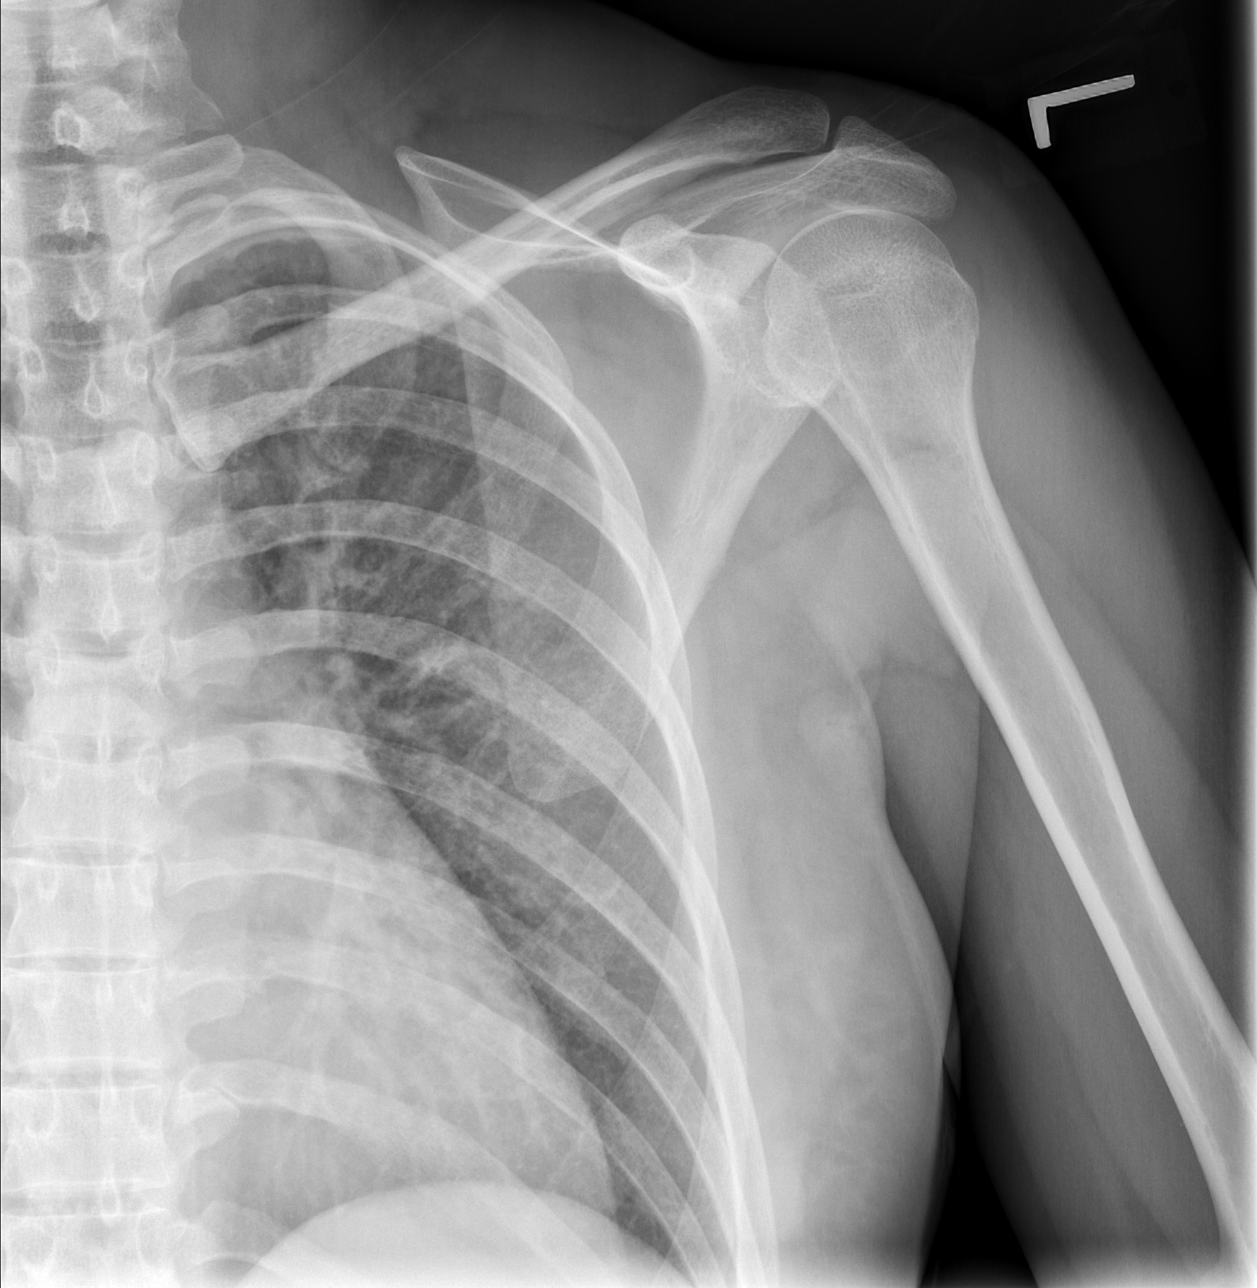

[t shoulder ap external left]
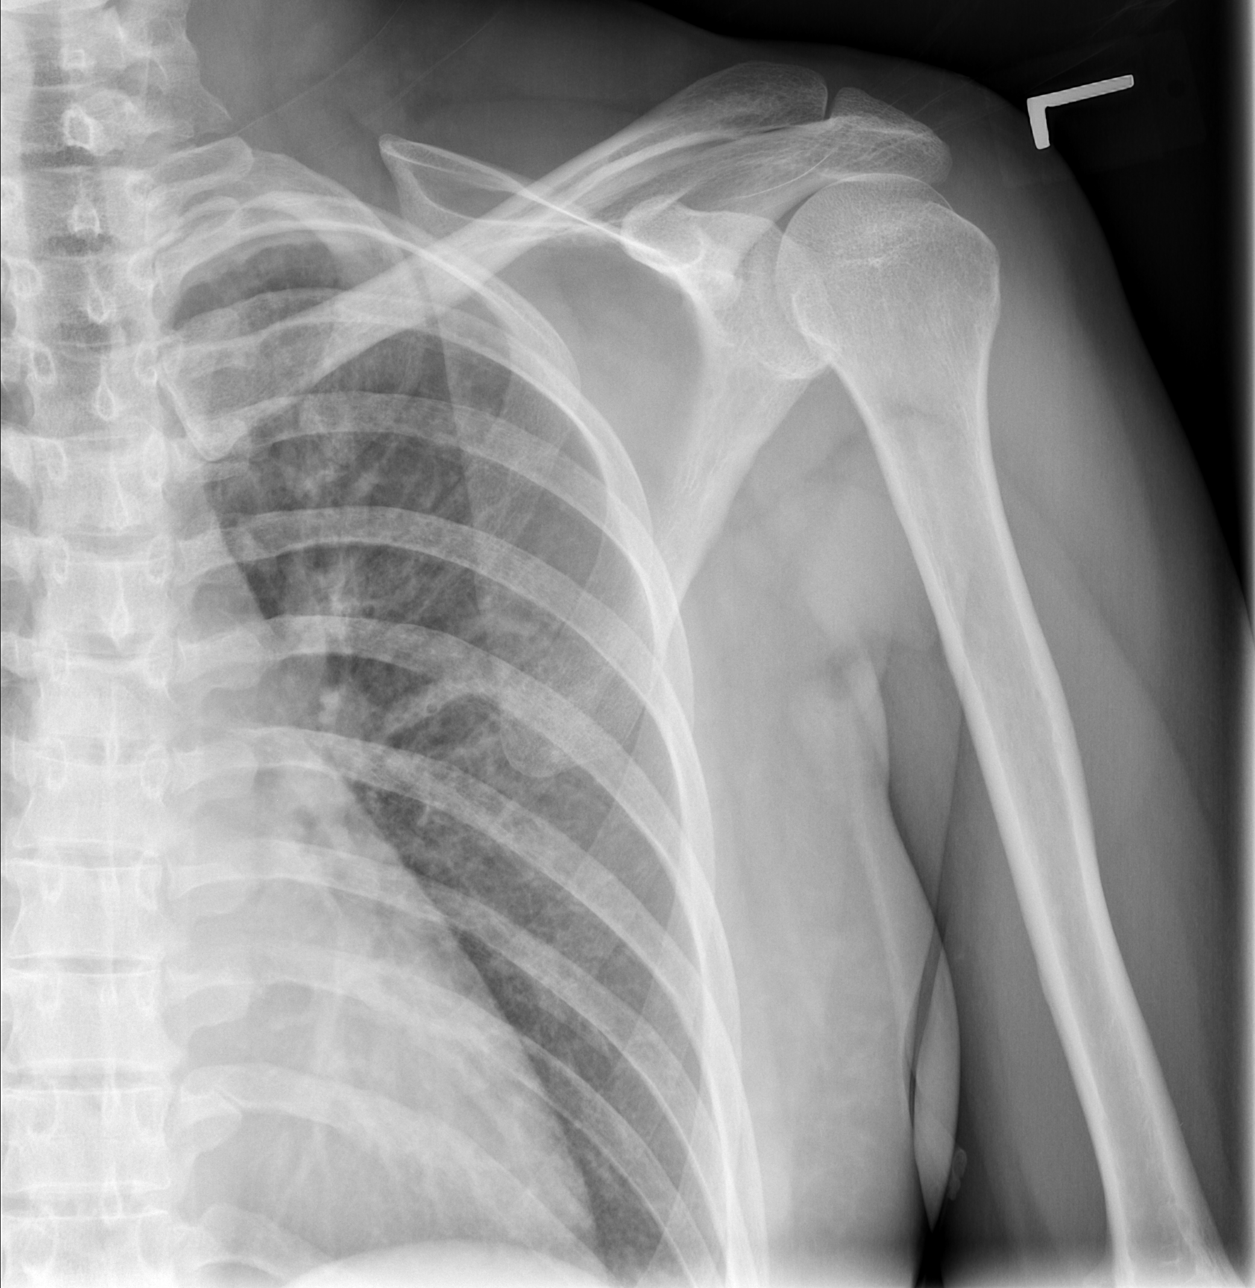

[t shoulder y view left]
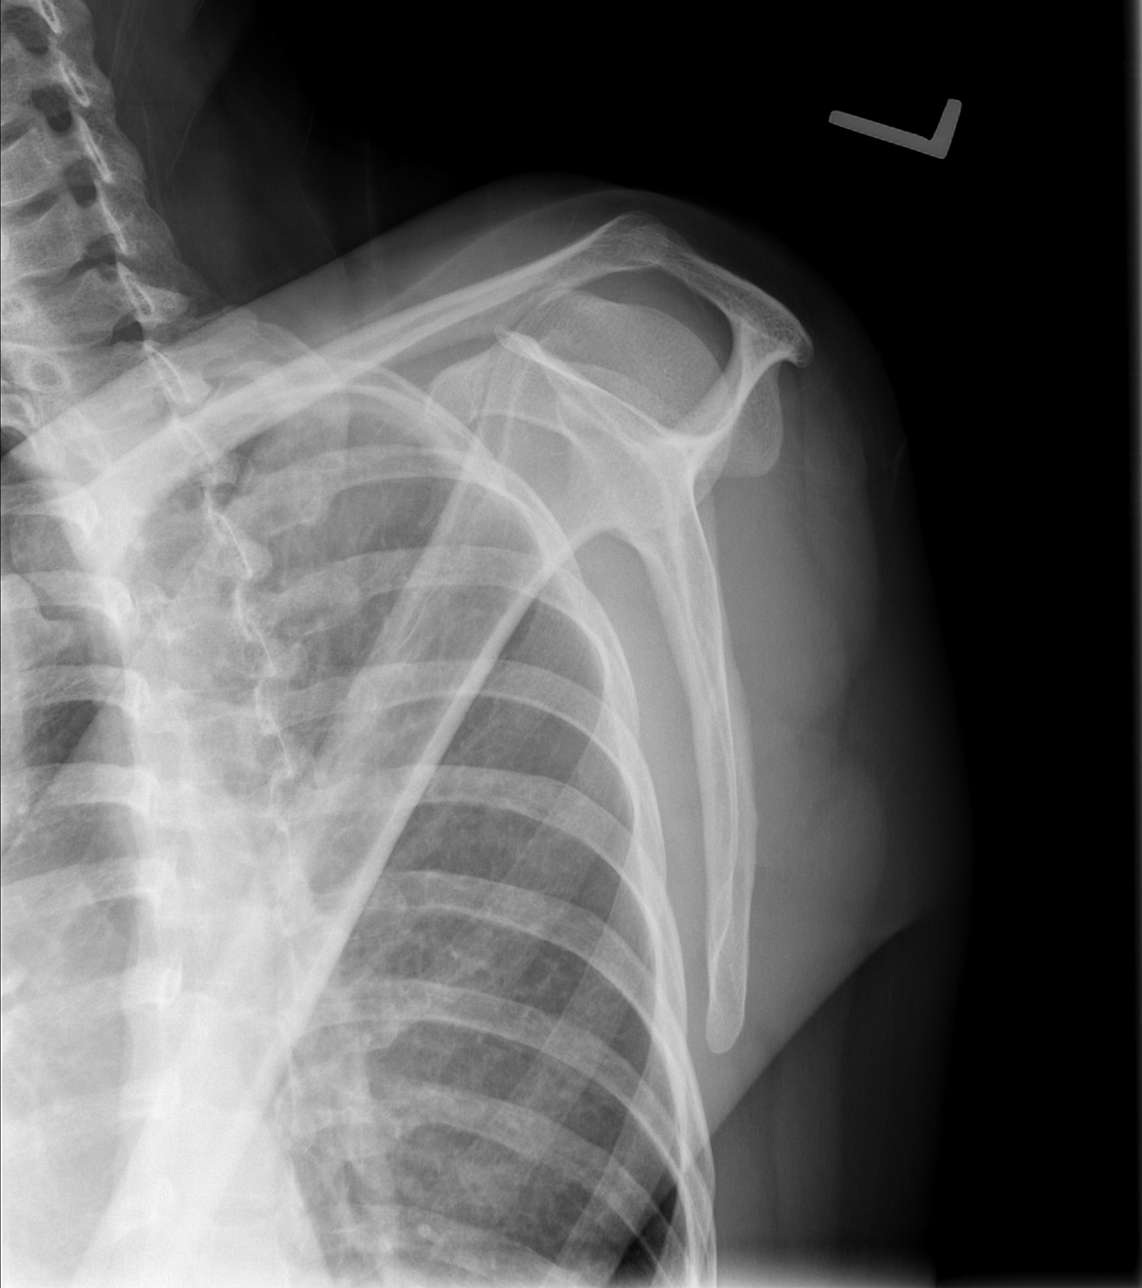

[3 of 3 positions shown; findings below may reference images not displayed]

FINDINGS: The patient was unable to externally rotate the left
humerus during imaging, reducing diagnostic sensitivity and
specificity.

No shoulder fracture or dislocation identified. The acromial
undersurface is type 1 (flat).
IMPRESSION: 1.  No fracture or dislocation identified.
2.  The patient was unable to externally rotate the shoulder during
imaging.

## 2013-05-01 ENCOUNTER — Emergency Department (HOSPITAL_COMMUNITY)
Admission: EM | Admit: 2013-05-01 | Discharge: 2013-05-01 | Disposition: A | Discharge status: Home / Self Care | Payer: 59 | Attending: Emergency Medicine | Admitting: Emergency Medicine

## 2013-05-01 ENCOUNTER — Encounter (HOSPITAL_COMMUNITY): Payer: Self-pay

## 2013-05-01 ENCOUNTER — Other Ambulatory Visit: Payer: Self-pay | Admitting: Physician Assistant

## 2013-05-01 ENCOUNTER — Emergency Department (INDEPENDENT_AMBULATORY_CARE_PROVIDER_SITE_OTHER)
Admission: EM | Admit: 2013-05-01 | Discharge: 2013-05-01 | Disposition: A | Discharge status: Other Acute Inpatient Hospital | Payer: Self-pay | Source: Home / Self Care | Attending: Family Medicine | Admitting: Family Medicine

## 2013-05-01 ENCOUNTER — Encounter (HOSPITAL_COMMUNITY): Payer: Self-pay | Admitting: *Deleted

## 2013-05-01 ENCOUNTER — Emergency Department (HOSPITAL_COMMUNITY): Payer: Self-pay

## 2013-05-01 DIAGNOSIS — R42 Dizziness and giddiness: Secondary | ICD-10-CM | POA: Insufficient documentation

## 2013-05-01 DIAGNOSIS — R0602 Shortness of breath: Secondary | ICD-10-CM | POA: Insufficient documentation

## 2013-05-01 DIAGNOSIS — K900 Celiac disease: Secondary | ICD-10-CM | POA: Insufficient documentation

## 2013-05-01 DIAGNOSIS — R0789 Other chest pain: Secondary | ICD-10-CM | POA: Insufficient documentation

## 2013-05-01 DIAGNOSIS — R61 Generalized hyperhidrosis: Secondary | ICD-10-CM | POA: Insufficient documentation

## 2013-05-01 DIAGNOSIS — Z87891 Personal history of nicotine dependence: Secondary | ICD-10-CM | POA: Insufficient documentation

## 2013-05-01 DIAGNOSIS — R079 Chest pain, unspecified: Secondary | ICD-10-CM

## 2013-05-01 DIAGNOSIS — Z3202 Encounter for pregnancy test, result negative: Secondary | ICD-10-CM | POA: Insufficient documentation

## 2013-05-01 HISTORY — DX: Celiac disease: K90.0

## 2013-05-01 LAB — CBC
HCT: 36.7 % (ref 36.0–46.0)
Hemoglobin: 13.4 g/dL (ref 12.0–15.0)
RBC: 4.32 MIL/uL (ref 3.87–5.11)
WBC: 8.1 10*3/uL (ref 4.0–10.5)

## 2013-05-01 LAB — BASIC METABOLIC PANEL
BUN: 7 mg/dL (ref 6–23)
Chloride: 104 mEq/L (ref 96–112)
Glucose, Bld: 89 mg/dL (ref 70–99)
Potassium: 3.7 mEq/L (ref 3.5–5.1)

## 2013-05-01 LAB — GLUCOSE, CAPILLARY: Glucose-Capillary: 94 mg/dL (ref 70–99)

## 2013-05-01 LAB — POCT I-STAT TROPONIN I

## 2013-05-01 LAB — POCT PREGNANCY, URINE: Preg Test, Ur: NEGATIVE

## 2013-05-01 IMAGING — CR DG CHEST 2V
2 series · 2 of 2 positions shown · non-contrast
Comparison: None.

CLINICAL DATA: Chest pain

CHEST - 2 VIEW

[w chest pa]
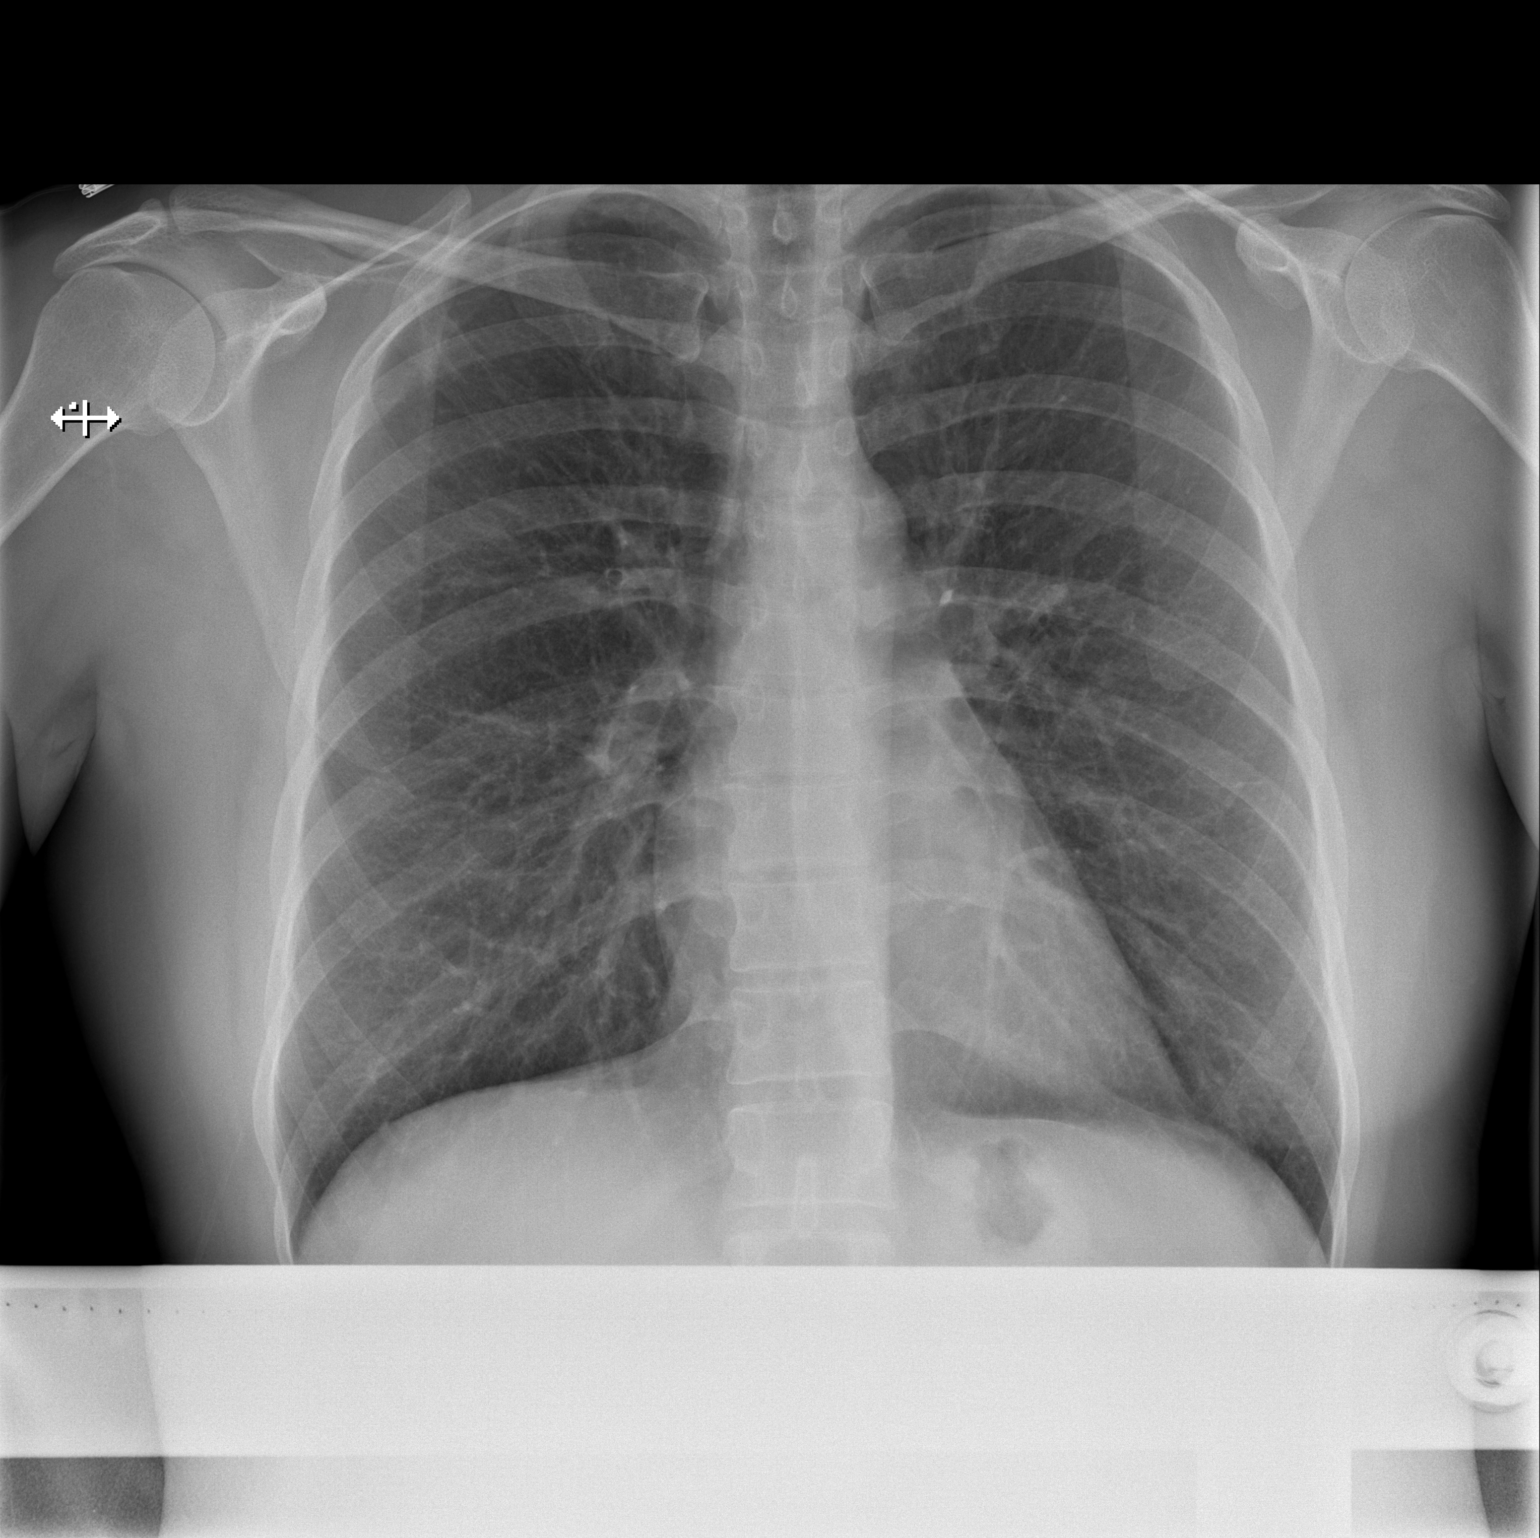

[w chest lat]
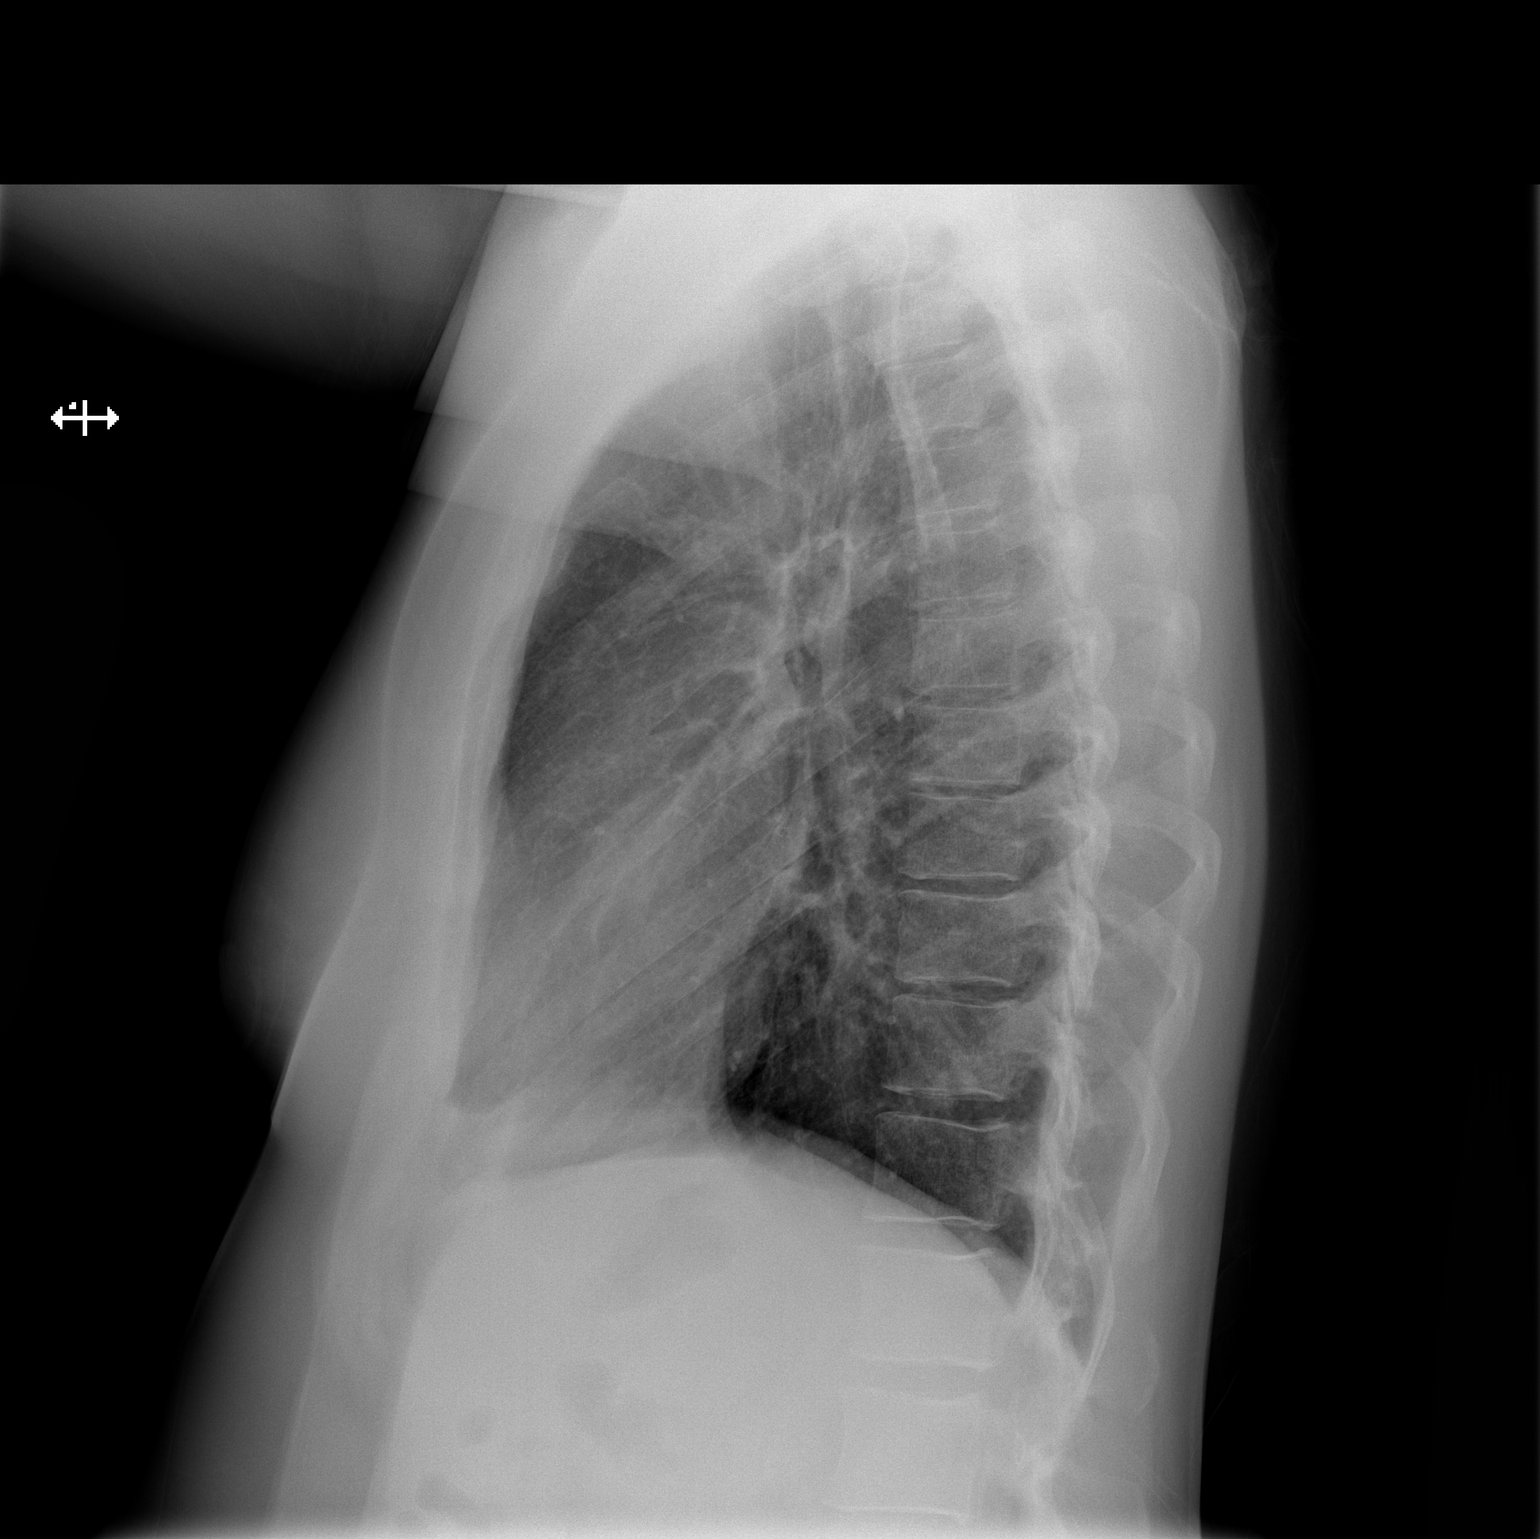

[2 of 2 positions shown; findings below may reference images not displayed]

FINDINGS: The lungs are clear without focal consolidation, edema,
effusion or pneumothorax.  Cardiopericardial silhouette is within
normal limits for size.  Imaged bony structures of the thorax are
intact.
IMPRESSION: No acute cardiopulmonary findings.

## 2013-05-01 MED ORDER — ONDANSETRON HCL 4 MG/2ML IJ SOLN
4.0000 mg | Freq: Once | INTRAMUSCULAR | Status: AC
  Administered 2013-05-01: 4 mg via INTRAVENOUS
  Filled 2013-05-01: qty 2

## 2013-05-01 MED ORDER — MECLIZINE HCL 25 MG PO TABS
25.0000 mg | ORAL_TABLET | Freq: Once | ORAL | Status: AC
  Administered 2013-05-01: 25 mg via ORAL
  Filled 2013-05-01: qty 1

## 2013-05-01 MED ORDER — ASPIRIN 81 MG PO CHEW
324.0000 mg | CHEWABLE_TABLET | Freq: Once | ORAL | Status: AC
  Administered 2013-05-01: 324 mg via ORAL
  Filled 2013-05-01: qty 4

## 2013-05-01 MED ORDER — ASPIRIN 81 MG PO CHEW: 324.0000 mg | CHEWABLE_TABLET | Freq: Once | ORAL | Status: DC

## 2013-05-01 MED ORDER — MECLIZINE HCL 25 MG PO TABS: 25.0000 mg | ORAL_TABLET | Freq: Three times a day (TID) | ORAL | Status: AC | PRN

## 2013-05-01 MED ORDER — HYDROMORPHONE HCL PF 1 MG/ML IJ SOLN
1.0000 mg | Freq: Once | INTRAMUSCULAR | Status: AC
  Administered 2013-05-01: 1 mg via INTRAVENOUS
  Filled 2013-05-01: qty 1

## 2013-05-01 NOTE — ED Notes (Addendum)
States she had episode og dizziness and sweating  Last night and today had pain mid chest through to back, down into right arm w dizziness; no sweating at present, w/d/color good; dizziness worse w movement or w eye closed  Father had CABG & CVA, mother COPD, brother CVA

## 2013-05-01 NOTE — ED Provider Notes (Signed)
Care assumed from [facility], [place].  Ebony Galvan is a 42 y.o. female presents without PMHx c/o 2 episodes of CP since last night with assoc SOB, diaphoresis and dizziness.  Pt is currently pain free and her troponin is negative.  [facility] has been officially consulted and will evaluate pt with disposition recommendation.    4:54 PM Pt evaluated by [facility] cardiology.  The recommend:  1. Chest pain - atypical symptoms, troponin negative, EKG unremarkable. Plan outpatient echo and f/u in [place] office 2. Dizziness - ? Vertigo. Does not appear arrhythmogenic . Can obtain outpatient echocardiogram.  3. Nausea/change in stool caliber with history of celiac disease - advised GI f/u.  Pt given meclazine in the department with some resolution of symptoms.  Will d/c home with GI follow-up.  I have also discussed reasons to return immediately to the ER.  Patient expresses understanding and agrees with plan.     Dahlia Client Norval Slaven, PA-C 05/01/13 1831

## 2013-05-01 NOTE — ED Provider Notes (Signed)
  Medical screening examination/treatment/procedure(s) were performed by non-physician practitioner and as supervising physician I was immediately available for consultation/collaboration.    Senay Sistrunk D Everette Mall, MD 05/01/13 2344 

## 2013-05-01 NOTE — ED Notes (Signed)
Patient is alert and orientedx4.  Patient was explained discharge instructions and they understood them with no questions.  The patient's friend, Gerlene Burdock Combo, is taking the patient home.

## 2013-05-01 NOTE — Progress Notes (Signed)
Left message on our scheduling voicemail to call pt for 2d echocardiogram and office appointment. We will call her with these appts. Mirko Tailor PA-C

## 2013-05-01 NOTE — Consult Note (Addendum)
History and Physical  Patient ID: Ebony Galvan MRN: [id number], DOB: Mar 20, 1971 Date of Encounter: 05/01/2013, 5:09 PM Primary Physician: No PCP Per Patient Primary Cardiologist: None, being seen by Dr. Elease Hashimoto  Chief Complaint: dizziness, chest pain, nausea, abnormal stools Reason for Admission: dizziness, chest pain  HPI: Ebony Galvan is a 42 y/o history with prior diagnosed celiac disease (not following any particular diet), 55yrs tobacco abuse, family history of CAD but no other medical history who presented to [facility] with complaints of dizziness and chest discomfort. For the past week she has been feeling intermittently dizzy and nauseated. The dizziness can occur in any position but seems to be worse lying down and when she changes positions. 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 It resolved spontaneously and she went on with her day. She has been under a lot of stress at work. This morning around 9 am, she developed a squeezing sensation in the middle of her chest radiating to her R arm with R hand tingling.  The chest pain persisted longer than an hour so she went to Urgent Care. CBG was 94 and EKG nonacute. She was thus referred to the ER and received ASA 324mg , dilaudid 1mg  (also had headache), and Zofran with relief of discomfort. The chest pain came back for about 30 minutes while here in the ER then subsided on its own. She thinks she may have felt fluttering of her heart with the CP. She also had recurrence of dizziness while in the ER with stable VS. There has been no associated arrhythmia on telemetry. Orthostatic VS are negative. She gets dizzy when sitting forward as well. She reports weight gain of 20 lbs in 6 weeks. She also reports that her stools have begun to look abnormal, i.e. greasy and "like a piece of lettuce running through them." She was able to mow the lawn  (push mower) 4 days ago without any limiting SOB or CP. CP is not made worse by exertion, inspiration, or movement. She is not tachycardic, tachypnic, or hypoxic. No recent surgery, bedrest, travel. No LEE or LE erythema. CBC, BMET, troponin unremarkable. CXR nonacute. She is currently asymptomatic.   Past Medical History  Diagnosis Date  . Celiac disease     a. Dx many years ago but has not followed any particular diet.     Most Recent Cardiac Studies: None   Surgical History:  Past Surgical History  Procedure Laterality Date  . Cholecystectomy       Home Meds: Prior to Admission medications   Medication Sig Start Date End Date Taking? Authorizing Provider  Aspirin-Salicylamide-Caffeine (BC HEADACHE POWDER PO) Take 1 packet by mouth once.   Yes Historical Provider, MD    Allergies: No Known Allergies  History   Social History  . Marital Status: Legally Separated    Spouse Name: N/A    Number of Children: N/A  . Years of Education: N/A   Occupational History  . Not on file.   Social History Main Topics  . Smoking status: Former Games developer  . Smokeless tobacco: Not on file     Comment: Quit in 2013 - smoked for 5 years  . Alcohol Use: Yes     Comment: Few times a year  . Drug Use: No  . Sexually Active: Not on file   Other Topics Concern  . Not on file   Social History Narrative  .  No narrative on file     Family History  Problem Relation Age of Onset  . Stroke Father     Passed age 44 (unclear reasons)  . COPD Mother     Passed in her 75's  . Cirrhosis Father   . Seizures Father   . CAD Father     Stents starting at 55-50 yrs old  . Seizures Brother     Review of Systems: General: negative for chills, fever, night sweats. +wt change as above Cardiovascular: negative for chest pain, edema, orthopnea, palpitations, paroxysmal nocturnal dyspnea, shortness of breath or dyspnea on exertion Dermatological: negative for rash Respiratory: negative for cough or  wheezing Urologic: negative for hematuria Abdominal: negative for nausea, vomiting, diarrhea, bright red blood per rectum, melena, or hematemesis Neurologic: negative for visual changes, syncope, or dizziness All other systems reviewed and are otherwise negative except as noted above.  Labs:   Lab Results  Component Value Date   WBC 8.1 05/01/2013   HGB 13.4 05/01/2013   HCT 36.7 05/01/2013   MCV 85.0 05/01/2013   PLT 219 05/01/2013    Recent Labs Lab 05/01/13 1355  NA 137  K 3.7  CL 104  CO2 26  BUN 7  CREATININE 0.65  CALCIUM 9.0  GLUCOSE 89    Radiology/Studies:  Dg Chest 2 View 05/01/2013  *RADIOLOGY REPORT*  Clinical Data: Chest pain  CHEST - 2 VIEW  Comparison: None.  Findings: The lungs are clear without focal consolidation, edema, effusion or pneumothorax.  Cardiopericardial silhouette is within normal limits for size.  Imaged bony structures of the thorax are intact.  IMPRESSION: No acute cardiopulmonary findings.   Original Report Authenticated By: Kennith Center, M.D.     EKG: NSR 75bpm, unremarkable, no acute ST-T changes  Physical Exam: Blood pressure 110/72, pulse 67, temperature 98.4 F (36.9 C), temperature source Oral, resp. rate 20, last menstrual period 04/29/2013, SpO2 100.00%. General: Well developed, well nourished WF in no acute distress. Head: Normocephalic, atraumatic, sclera non-icteric, no xanthomas, nares are without discharge.  Neck: Negative for carotid bruits. JVD not elevated. Lungs: Clear bilaterally to auscultation without wheezes, rales, or rhonchi. Breathing is unlabored. Heart: RRR with S1 S2. No murmurs, rubs, or gallops appreciated. Abdomen: Soft, non-tender, non-distended with normoactive bowel sounds. No hepatomegaly. No rebound/guarding. No obvious abdominal masses. Msk:  Strength and tone appear normal for age. Extremities: No clubbing or cyanosis. No edema.  Distal pedal pulses are 2+ and equal bilaterally. Neuro: Alert and oriented X 3. No  focal deficit. No facial asymmetry. Moves all extremities spontaneously. Psych:  Responds to questions appropriately with a normal affect.    ASSESSMENT AND PLAN:  1. Chest pain - atypical symptoms, troponin negative, EKG unremarkable. Plan outpatient echo and f/u in our office. Our office will call her with this appointment.  2. Dizziness - ? Vertigo. Does not appear arrhythmogenic. Can obtain outpatient echocardiogram. 3. Nausea/change in stool caliber with history of celiac disease - advised GI f/u.  Signed, Ronie Spies PA-C 05/01/2013, 5:09 PM  Attending Note:   The patient was seen and examined.  Agree with assessment and plan as noted above.  Changes made to the above note as needed.  Ebony Galvan is a 42 yo with hx of celiac disease for years.  She presents today with very atypical symptoms, nausea, dizziness.  +/- CP.  She has had significant changes in her stool - she makes no attempt to avoid gluten products.    Exam is  as above and is unremarkable.  At this point, I do not see any concern for cardiac issues.  She has been eating lots of gluten products and has celiac disease. Her main complaints tonight are of significant changes in her bowel movements and nausea.  I think she needs a GI consult.  She also needs a primary medical doctor.  Urine pregnancy test is negative.   I will be happy to see her in the office after her echo.    Vesta Mixer, Montez Hageman., MD, [facility] 05/01/2013, 5:34 PM

## 2013-05-01 NOTE — ED Provider Notes (Signed)
History     CSN: [id number]  Arrival date & time 05/01/13  1204   First MD Initiated Contact with Patient 05/01/13 1225      Chief Complaint  Patient presents with  . Chest Pain    (Consider location/radiation/quality/duration/timing/severity/associated sxs/prior treatment) HPI Comments: 42 year old nonsmoker female with no significant past medical history. Comments concerned about episodic events of dizziness and sweating that started last evening. Similar symptoms this morning at 6 AM but associated to retrosternal chest pain with radiation to worse the right arm. Symptoms associated with shortness of breath. Chest pain improving currently. Still feels dizzy. Denies fever or chills. No dysuria or hematuria. Denies abdominal pain nausea vomiting or diarrhea. No cough or congestion. Father had history of CABG and CVA.   History reviewed. No pertinent past medical history.  History reviewed. No pertinent past surgical history.  Family History  Problem Relation Age of Onset  . Stroke Father     History  Substance Use Topics  . Smoking status: Not on file  . Smokeless tobacco: Not on file  . Alcohol Use: Not on file    OB History   Grav Para Term Preterm Abortions TAB SAB Ect Mult Living                  Review of Systems  Constitutional: Positive for diaphoresis. Negative for fever, chills, appetite change and fatigue.  HENT: Negative for ear pain, congestion and tinnitus.   Eyes: Negative for visual disturbance.  Respiratory: Positive for shortness of breath. Negative for cough and wheezing.   Cardiovascular: Positive for chest pain.  Gastrointestinal: Negative for nausea, vomiting, abdominal pain and diarrhea.  Genitourinary: Negative for dysuria, frequency and hematuria.  Skin: Negative for rash.  Neurological: Positive for dizziness. Negative for seizures, speech difficulty, weakness and numbness.  All other systems reviewed and are negative.    Allergies   Review of patient's allergies indicates no known allergies.  Home Medications  No current outpatient prescriptions on file.  BP 118/58  Pulse 79  Temp(Src) 97.9 F (36.6 C) (Oral)  Resp 16  SpO2 100%  Physical Exam  Nursing note and vitals reviewed. Constitutional: She is oriented to person, place, and time. She appears well-developed and well-nourished. No distress.  HENT:  Head: Normocephalic and atraumatic.  Mouth/Throat: Oropharynx is clear and moist.  Eyes: Conjunctivae are normal. Right eye exhibits no discharge. Left eye exhibits no discharge. No scleral icterus.  Neck: Neck supple.  Cardiovascular: Normal rate, regular rhythm, normal heart sounds and intact distal pulses.  Exam reveals no gallop and no friction rub.   No murmur heard. Pulmonary/Chest: Effort normal and breath sounds normal. No respiratory distress. She has no wheezes. She has no rales. She exhibits no tenderness.  Abdominal: Soft. She exhibits no distension and no mass. There is no tenderness. There is no rebound and no guarding.  Lymphadenopathy:    She has no cervical adenopathy.  Neurological: She is alert and oriented to person, place, and time.  Skin: No rash noted. She is not diaphoretic.    ED Course  Procedures (including critical care time)  Labs Reviewed  GLUCOSE, CAPILLARY   No results found.   1. Chest pain     EKG: Normal sinus rhythm with ventricular rate 75 beats per minutes and no acute ischemic changes.  MDM   42 year old female here complaining of episodic dizziness and diaphoresis that started last night. Symptoms associated with chest pain at 6 AM this morning. Reports radiation  to worse the right arm. Symptoms associated with shortness of breath. Denies vomiting, abdominal pain or diarrhea. Chest pain spontaneously resolving currently.  Vital signs normal and stable. Point-of-care blood sugar: 94. In the differential vertigo, pregnancy etc but association with chest pain  concerning. Decided to transfer to the emergency department for further evaluation and management.        Sharin Grave, MD 05/01/13 1248

## 2013-05-01 NOTE — ED Notes (Signed)
Patient said she started having pain in her chest today, and says her bowel movements have been abnormal for a week. She said it is not diarrhea, but says they do not look normal.  They are not watery or formed just "look funny, looks like cabbage".  Patient says she is having central chest and rates it 6/10 accompanied with SOB, weakness, back pain and nausea no vomiting.

## 2013-05-01 NOTE — ED Provider Notes (Signed)
History     CSN: [id number]  Arrival date & time 05/01/13  1257   First MD Initiated Contact with Patient 05/01/13 1308      Chief Complaint  Patient presents with  . Chest Pain    (Consider location/radiation/quality/duration/timing/severity/associated sxs/prior treatment) HPI Comments: Patient is a 42 year old female with no significant past medical history who presents with 2 episodes of chest pain since last night. Last night, her chest pain started suddenly and remained constant for about 1 hour. Patient cannot characterize the pain but states it was severe. The pain radiated down bilateral arms. She reports associated dizziness, diaphoresis, and SOB. The episode lasted 1 hour before resolving. Patient reports a similar episode this morning starting at 6am and lasted for "a few hours." No aggravating/alleviating factors. Patient quit smoking 1 year ago and paternal history of CABG.    History reviewed. No pertinent past medical history.  History reviewed. No pertinent past surgical history.  Family History  Problem Relation Age of Onset  . Stroke Father     History  Substance Use Topics  . Smoking status: Not on file  . Smokeless tobacco: Not on file  . Alcohol Use: No    OB History   Grav Para Term Preterm Abortions TAB SAB Ect Mult Living                  Review of Systems  Constitutional: Positive for diaphoresis.  Cardiovascular: Positive for chest pain.  Neurological: Positive for dizziness.  All other systems reviewed and are negative.    Allergies  Review of patient's allergies indicates no known allergies.  Home Medications   Current Outpatient Rx  Name  Route  Sig  Dispense  Refill  . Aspirin-Salicylamide-Caffeine (BC HEADACHE POWDER PO)   Oral   Take 1 packet by mouth once.           BP 110/72  Pulse 67  Temp(Src) 98.4 F (36.9 C) (Oral)  Resp 20  SpO2 100%  LMP 04/29/2013  Physical Exam  Nursing note and vitals  reviewed. Constitutional: She is oriented to person, place, and time. She appears well-developed and well-nourished. No distress.  HENT:  Head: Normocephalic and atraumatic.  Eyes: Conjunctivae are normal.  Neck: Normal range of motion.  Cardiovascular: Normal rate and regular rhythm.  Exam reveals no gallop and no friction rub.   No murmur heard. Pulmonary/Chest: Effort normal and breath sounds normal. She has no wheezes. She has no rales. She exhibits tenderness.  Mild central chest tenderness to palpation.   Abdominal: Soft. She exhibits no distension. There is no tenderness. There is no rebound and no guarding.  Musculoskeletal: Normal range of motion.  Neurological: She is alert and oriented to person, place, and time. Coordination normal.  Speech is goal-oriented. Moves limbs without ataxia.   Skin: Skin is warm and dry.  Psychiatric: She has a normal mood and affect. Her behavior is normal.    ED Course  Procedures (including critical care time)  Labs Reviewed  CBC - Abnormal; Notable for the following:    MCHC 36.5 (*)    All other components within normal limits  BASIC METABOLIC PANEL  POCT I-STAT TROPONIN I  POCT PREGNANCY, URINE   Dg Chest 2 View  05/01/2013  *RADIOLOGY REPORT*  Clinical Data: Chest pain  CHEST - 2 VIEW  Comparison: None.  Findings: The lungs are clear without focal consolidation, edema, effusion or pneumothorax.  Cardiopericardial silhouette is within normal limits for  size.  Imaged bony structures of the thorax are intact.  IMPRESSION: No acute cardiopulmonary findings.   Original Report Authenticated By: Kennith Center, M.D.      1. Chest discomfort   2. Celiac disease       MDM  2:17 PM Labs and chest xray pending.   3:19 PM Labs and chest xray unremarkable. Troponin negative. Vitals stable and patient afebrile.   3:30 PM [facility] to see the patient for disposition.       Emilia Beck, PA-C 05/02/13 1012

## 2013-05-01 NOTE — ED Notes (Signed)
Pt sent here for further eval from [facility]. Had episode of dizziness and diaphoresis last night, cp that started this am 0600, sharp right side cp that radiates into her arm. havign sob. Airway intact, no acute distress noted at this time.

## 2013-05-05 NOTE — ED Provider Notes (Signed)
Medical screening examination/treatment/procedure(s) were conducted as a shared visit with non-physician practitioner(s) and myself.  I personally evaluated the patient during the encounter.  Chest pain with dizziness, diaphoresis, dyspnea. Consult cardiology   Donnetta Hutching, MD 05/05/13 0800

## 2013-05-10 ENCOUNTER — Other Ambulatory Visit (HOSPITAL_COMMUNITY): Payer: Self-pay

## 2013-05-30 ENCOUNTER — Encounter: Payer: Self-pay | Admitting: Physician Assistant

## 2016-04-11 DIAGNOSIS — T300 Burn of unspecified body region, unspecified degree: Secondary | ICD-10-CM | POA: Insufficient documentation

## 2016-07-20 DIAGNOSIS — M545 Low back pain, unspecified: Secondary | ICD-10-CM | POA: Insufficient documentation

## 2016-07-20 DIAGNOSIS — F419 Anxiety disorder, unspecified: Secondary | ICD-10-CM | POA: Insufficient documentation

## 2016-07-20 DIAGNOSIS — R51 Headache: Secondary | ICD-10-CM

## 2016-07-20 DIAGNOSIS — R519 Headache, unspecified: Secondary | ICD-10-CM | POA: Insufficient documentation

## 2016-08-01 DIAGNOSIS — M159 Polyosteoarthritis, unspecified: Secondary | ICD-10-CM | POA: Insufficient documentation

## 2016-08-01 DIAGNOSIS — M542 Cervicalgia: Secondary | ICD-10-CM | POA: Insufficient documentation

## 2016-08-01 DIAGNOSIS — M2550 Pain in unspecified joint: Secondary | ICD-10-CM | POA: Insufficient documentation

## 2016-08-09 DIAGNOSIS — R202 Paresthesia of skin: Secondary | ICD-10-CM

## 2016-08-09 DIAGNOSIS — R200 Anesthesia of skin: Secondary | ICD-10-CM | POA: Insufficient documentation

## 2016-08-22 ENCOUNTER — Ambulatory Visit: Payer: Self-pay | Admitting: Podiatry

## 2016-08-25 ENCOUNTER — Ambulatory Visit (INDEPENDENT_AMBULATORY_CARE_PROVIDER_SITE_OTHER): Payer: Medicaid Other

## 2016-08-25 ENCOUNTER — Encounter: Payer: Self-pay | Admitting: Sports Medicine

## 2016-08-25 ENCOUNTER — Encounter (INDEPENDENT_AMBULATORY_CARE_PROVIDER_SITE_OTHER): Payer: Self-pay

## 2016-08-25 ENCOUNTER — Ambulatory Visit (INDEPENDENT_AMBULATORY_CARE_PROVIDER_SITE_OTHER): Payer: Medicaid Other | Admitting: Sports Medicine

## 2016-08-25 DIAGNOSIS — M7662 Achilles tendinitis, left leg: Secondary | ICD-10-CM

## 2016-08-25 DIAGNOSIS — M7661 Achilles tendinitis, right leg: Secondary | ICD-10-CM

## 2016-08-25 DIAGNOSIS — M79673 Pain in unspecified foot: Secondary | ICD-10-CM

## 2016-08-25 DIAGNOSIS — L84 Corns and callosities: Secondary | ICD-10-CM | POA: Diagnosis not present

## 2016-08-25 MED ORDER — TRIAMCINOLONE ACETONIDE 10 MG/ML IJ SUSP: 10.0000 mg | Freq: Once | INTRAMUSCULAR | Status: AC

## 2016-08-25 MED ORDER — DICLOFENAC SODIUM 75 MG PO TBEC: 75.0000 mg | DELAYED_RELEASE_TABLET | Freq: Two times a day (BID) | ORAL | 1 refills | Status: AC

## 2016-08-25 NOTE — Patient Instructions (Addendum)
[employer] Healthy Feet cream for Callus skin at heels  Achilles Tendinitis With Rehab Achilles tendinitis is a disorder of the Achilles tendon. The Achilles tendon connects the large calf muscles (Gastrocnemius and Soleus) to the heel bone (calcaneus). This tendon is sometimes called the heel cord. It is important for pushing-off and standing on your toes and is important for walking, running, or jumping. Tendinitis is often caused by overuse and repetitive microtrauma. SYMPTOMS  Pain, tenderness, swelling, warmth, and redness may occur over the Achilles tendon even at rest.  Pain with pushing off, or flexing or extending the ankle.  Pain that is worsened after or during activity. CAUSES   Overuse sometimes seen with rapid increase in exercise programs or in sports requiring running and jumping.  Poor physical conditioning (strength and flexibility or endurance).  Running sports, especially training running down hills.  Inadequate warm-up before practice or play or failure to stretch before participation.  Injury to the tendon. PREVENTION   Warm up and stretch before practice or competition.  Allow time for adequate rest and recovery between practices and competition.  Keep up conditioning.  Keep up ankle and leg flexibility.  Improve or keep muscle strength and endurance.  Improve cardiovascular fitness.  Use proper technique.  Use proper equipment (shoes, skates).  To help prevent recurrence, taping, protective strapping, or an adhesive bandage may be recommended for several weeks after healing is complete. PROGNOSIS   Recovery may take weeks to several months to heal.  Longer recovery is expected if symptoms have been prolonged.  Recovery is usually quicker if the inflammation is due to a direct blow as compared with overuse or sudden strain. RELATED COMPLICATIONS   Healing time will be prolonged if the condition is not correctly treated. The injury must be given  plenty of time to heal.  Symptoms can reoccur if activity is resumed too soon.  Untreated, tendinitis may increase the risk of tendon rupture requiring additional time for recovery and possibly surgery. TREATMENT   The first treatment consists of rest anti-inflammatory medication, and ice to relieve the pain.  Stretching and strengthening exercises after resolution of pain will likely help reduce the risk of recurrence. Referral to a physical therapist or athletic trainer for further evaluation and treatment may be helpful.  A walking boot or cast may be recommended to rest the Achilles tendon. This can help break the cycle of inflammation and microtrauma.  Arch supports (orthotics) may be prescribed or recommended by your caregiver as an adjunct to therapy and rest.  Surgery to remove the inflamed tendon lining or degenerated tendon tissue is rarely necessary and has shown less than predictable results. MEDICATION   Nonsteroidal anti-inflammatory medications, such as aspirin and ibuprofen, may be used for pain and inflammation relief. Do not take within 7 days before surgery. Take these as directed by your caregiver. Contact your caregiver immediately if any bleeding, stomach upset, or signs of allergic reaction occur. Other minor pain relievers, such as acetaminophen, may also be used.  Pain relievers may be prescribed as necessary by your caregiver. Do not take prescription pain medication for longer than 4 to 7 days. Use only as directed and only as much as you need.  Cortisone injections are rarely indicated. Cortisone injections may weaken tendons and predispose to rupture. It is better to give the condition more time to heal than to use them. HEAT AND COLD  Cold is used to relieve pain and reduce inflammation for acute and chronic Achilles tendinitis.  Cold should be applied for 10 to 15 minutes every 2 to 3 hours for inflammation and pain and immediately after any activity that  aggravates your symptoms. Use ice packs or an ice massage.  Heat may be used before performing stretching and strengthening activities prescribed by your caregiver. Use a heat pack or a warm soak. SEEK MEDICAL CARE IF:  Symptoms get worse or do not improve in 2 weeks despite treatment.  New, unexplained symptoms develop. Drugs used in treatment may produce side effects. EXERCISES RANGE OF MOTION (ROM) AND STRETCHING EXERCISES - Achilles Tendinitis  These exercises may help you when beginning to rehabilitate your injury. Your symptoms may resolve with or without further involvement from your physician, physical therapist or athletic trainer. While completing these exercises, remember:   Restoring tissue flexibility helps normal motion to return to the joints. This allows healthier, less painful movement and activity.  An effective stretch should be held for at least 30 seconds.  A stretch should never be painful. You should only feel a gentle lengthening or release in the stretched tissue. STRETCH - Gastroc, Standing   Place hands on wall.  Extend right / left leg, keeping the front knee somewhat bent.  Slightly point your toes inward on your back foot.  Keeping your right / left heel on the floor and your knee straight, shift your weight toward the wall, not allowing your back to arch.  You should feel a gentle stretch in the right / left calf. Hold this position for __________ seconds. Repeat __________ times. Complete this stretch __________ times per day. STRETCH - Soleus, Standing   Place hands on wall.  Extend right / left leg, keeping the other knee somewhat bent.  Slightly point your toes inward on your back foot.  Keep your right / left heel on the floor, bend your back knee, and slightly shift your weight over the back leg so that you feel a gentle stretch deep in your back calf.  Hold this position for __________ seconds. Repeat __________ times. Complete this  stretch __________ times per day. STRETCH - Gastrocsoleus, Standing  Note: This exercise can place a lot of stress on your foot and ankle. Please complete this exercise only if specifically instructed by your caregiver.   Place the ball of your right / left foot on a step, keeping your other foot firmly on the same step.  Hold on to the wall or a rail for balance.  Slowly lift your other foot, allowing your body weight to press your heel down over the edge of the step.  You should feel a stretch in your right / left calf.  Hold this position for __________ seconds.  Repeat this exercise with a slight bend in your knee. Repeat __________ times. Complete this stretch __________ times per day.  STRENGTHENING EXERCISES - Achilles Tendinitis These exercises may help you when beginning to rehabilitate your injury. They may resolve your symptoms with or without further involvement from your physician, physical therapist or athletic trainer. While completing these exercises, remember:   Muscles can gain both the endurance and the strength needed for everyday activities through controlled exercises.  Complete these exercises as instructed by your physician, physical therapist or athletic trainer. Progress the resistance and repetitions only as guided.  You may experience muscle soreness or fatigue, but the pain or discomfort you are trying to eliminate should never worsen during these exercises. If this pain does worsen, stop and make certain you are following the  directions exactly. If the pain is still present after adjustments, discontinue the exercise until you can discuss the trouble with your clinician. STRENGTH - Plantar-flexors   Sit with your right / left leg extended. Holding onto both ends of a rubber exercise band/tubing, loop it around the ball of your foot. Keep a slight tension in the band.  Slowly push your toes away from you, pointing them downward.  Hold this position for  __________ seconds. Return slowly, controlling the tension in the band/tubing. Repeat __________ times. Complete this exercise __________ times per day.  STRENGTH - Plantar-flexors   Stand with your feet shoulder width apart. Steady yourself with a wall or table using as little support as needed.  Keeping your weight evenly spread over the width of your feet, rise up on your toes.*  Hold this position for __________ seconds. Repeat __________ times. Complete this exercise __________ times per day.  *If this is too easy, shift your weight toward your right / left leg until you feel challenged. Ultimately, you may be asked to do this exercise with your right / left foot only. STRENGTH - Plantar-flexors, Eccentric  Note: This exercise can place a lot of stress on your foot and ankle. Please complete this exercise only if specifically instructed by your caregiver.   Place the balls of your feet on a step. With your hands, use only enough support from a wall or rail to keep your balance.  Keep your knees straight and rise up on your toes.  Slowly shift your weight entirely to your right / left toes and pick up your opposite foot. Gently and with controlled movement, lower your weight through your right / left foot so that your heel drops below the level of the step. You will feel a slight stretch in the back of your calf at the end position.  Use the healthy leg to help rise up onto the balls of both feet, then lower weight only on the right / left leg again. Build up to 15 repetitions. Then progress to 3 consecutive sets of 15 repetitions.*  After completing the above exercise, complete the same exercise with a slight knee bend (about 30 degrees). Again, build up to 15 repetitions. Then progress to 3 consecutive sets of 15 repetitions.* Perform this exercise __________ times per day.  *When you easily complete 3 sets of 15, your physician, physical therapist or athletic trainer may advise you to  add resistance by wearing a backpack filled with additional weight. STRENGTH - Plantar Flexors, Seated   Sit on a chair that allows your feet to rest flat on the ground. If necessary, sit at the edge of the chair.  Keeping your toes firmly on the ground, lift your right / left heel as far as you can without increasing any discomfort in your ankle. Repeat __________ times. Complete this exercise __________ times a day. *If instructed by your physician, physical therapist or athletic trainer, you may add ____________________ of resistance by placing a weighted object on your right / left knee.   This information is not intended to replace advice given to you by your health care provider. Make sure you discuss any questions you have with your health care provider.   Document Released: 07/13/2005 Document Revised: 01/02/2015 Document Reviewed: 03/26/2009 Elsevier Interactive Patient Education [employer]
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
## 2016-08-31 ENCOUNTER — Telehealth: Payer: Self-pay | Admitting: Sports Medicine

## 2016-08-31 NOTE — Telephone Encounter (Signed)
PA to [employer] initiated. Auth # [id number] ID for phone call [id number] Fax to be sent to [facility] with decision   -Dr Marylene Land

## 2016-09-05 ENCOUNTER — Telehealth: Payer: Self-pay

## 2016-09-05 NOTE — Telephone Encounter (Signed)
[place] [facility] notified non approval of diclofenac Rx MID [id number] P

## 2016-09-06 ENCOUNTER — Other Ambulatory Visit: Payer: Self-pay

## 2016-09-06 MED ORDER — MELOXICAM 15 MG PO TABS: 15.0000 mg | ORAL_TABLET | Freq: Every day | ORAL | 0 refills | Status: AC

## 2016-09-06 NOTE — Telephone Encounter (Signed)
Lets Try Meloxicam 15mg  po daily, dispense 30 tabs Thanks Dr. 

## 2016-09-08 DIAGNOSIS — M069 Rheumatoid arthritis, unspecified: Secondary | ICD-10-CM | POA: Diagnosis not present

## 2016-09-08 DIAGNOSIS — F329 Major depressive disorder, single episode, unspecified: Secondary | ICD-10-CM | POA: Diagnosis not present

## 2016-09-08 DIAGNOSIS — Z87891 Personal history of nicotine dependence: Secondary | ICD-10-CM | POA: Diagnosis not present

## 2016-09-08 DIAGNOSIS — R079 Chest pain, unspecified: Secondary | ICD-10-CM | POA: Diagnosis not present

## 2016-09-08 DIAGNOSIS — E669 Obesity, unspecified: Secondary | ICD-10-CM | POA: Diagnosis not present

## 2016-09-08 DIAGNOSIS — Z8249 Family history of ischemic heart disease and other diseases of the circulatory system: Secondary | ICD-10-CM | POA: Diagnosis not present

## 2016-09-09 DIAGNOSIS — F329 Major depressive disorder, single episode, unspecified: Secondary | ICD-10-CM

## 2016-09-09 DIAGNOSIS — E669 Obesity, unspecified: Secondary | ICD-10-CM

## 2016-09-09 DIAGNOSIS — M069 Rheumatoid arthritis, unspecified: Secondary | ICD-10-CM | POA: Diagnosis not present

## 2016-09-09 DIAGNOSIS — R079 Chest pain, unspecified: Secondary | ICD-10-CM | POA: Diagnosis not present

## 2016-09-09 DIAGNOSIS — Z8249 Family history of ischemic heart disease and other diseases of the circulatory system: Secondary | ICD-10-CM | POA: Diagnosis not present

## 2016-09-09 DIAGNOSIS — Z87891 Personal history of nicotine dependence: Secondary | ICD-10-CM | POA: Diagnosis not present

## 2016-09-21 ENCOUNTER — Ambulatory Visit: Payer: Medicaid Other | Admitting: Sports Medicine

## 2016-09-30 ENCOUNTER — Ambulatory Visit: Payer: Medicaid Other | Admitting: Sports Medicine

## 2016-10-05 ENCOUNTER — Ambulatory Visit: Payer: Medicaid Other | Admitting: Sports Medicine

## 2016-11-02 ENCOUNTER — Ambulatory Visit (INDEPENDENT_AMBULATORY_CARE_PROVIDER_SITE_OTHER): Payer: Medicaid Other | Admitting: Sports Medicine

## 2016-11-02 ENCOUNTER — Encounter: Payer: Self-pay | Admitting: Sports Medicine

## 2016-11-02 DIAGNOSIS — M79672 Pain in left foot: Secondary | ICD-10-CM

## 2016-11-02 DIAGNOSIS — M7661 Achilles tendinitis, right leg: Secondary | ICD-10-CM

## 2016-11-02 DIAGNOSIS — M159 Polyosteoarthritis, unspecified: Secondary | ICD-10-CM

## 2016-11-02 DIAGNOSIS — M7662 Achilles tendinitis, left leg: Secondary | ICD-10-CM

## 2016-11-02 DIAGNOSIS — M79671 Pain in right foot: Secondary | ICD-10-CM

## 2016-11-02 DIAGNOSIS — L84 Corns and callosities: Secondary | ICD-10-CM

## 2016-11-02 MED ORDER — TRIAMCINOLONE ACETONIDE 40 MG/ML IJ SUSP: 20.0000 mg | Freq: Once | INTRAMUSCULAR | Status: AC
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
## 2022-05-30 ENCOUNTER — Emergency Department (HOSPITAL_COMMUNITY): Payer: Medicaid Other

## 2022-05-30 ENCOUNTER — Emergency Department (HOSPITAL_COMMUNITY)
Admission: EM | Admit: 2022-05-30 | Discharge: 2022-05-30 | Disposition: A | Discharge status: Home / Self Care | Payer: Medicaid Other | Attending: Emergency Medicine | Admitting: Emergency Medicine

## 2022-05-30 DIAGNOSIS — Z7982 Long term (current) use of aspirin: Secondary | ICD-10-CM | POA: Insufficient documentation

## 2022-05-30 DIAGNOSIS — R42 Dizziness and giddiness: Secondary | ICD-10-CM | POA: Diagnosis not present

## 2022-05-30 DIAGNOSIS — M545 Low back pain, unspecified: Secondary | ICD-10-CM | POA: Insufficient documentation

## 2022-05-30 LAB — CBC WITH DIFFERENTIAL/PLATELET
Abs Immature Granulocytes: 0.03 10*3/uL (ref 0.00–0.07)
Basophils Absolute: 0 10*3/uL (ref 0.0–0.1)
Basophils Relative: 0 %
Eosinophils Absolute: 0.1 10*3/uL (ref 0.0–0.5)
Eosinophils Relative: 1 %
HCT: 45 % (ref 36.0–46.0)
Hemoglobin: 15.3 g/dL — ABNORMAL HIGH (ref 12.0–15.0)
Immature Granulocytes: 0 %
Lymphocytes Relative: 25 %
Lymphs Abs: 3 10*3/uL (ref 0.7–4.0)
MCH: 30.9 pg (ref 26.0–34.0)
MCHC: 34 g/dL (ref 30.0–36.0)
MCV: 90.9 fL (ref 80.0–100.0)
Monocytes Absolute: 1 10*3/uL (ref 0.1–1.0)
Monocytes Relative: 8 %
Neutro Abs: 7.9 10*3/uL — ABNORMAL HIGH (ref 1.7–7.7)
Neutrophils Relative %: 66 %
Platelets: 221 10*3/uL (ref 150–400)
RBC: 4.95 MIL/uL (ref 3.87–5.11)
RDW: 12.7 % (ref 11.5–15.5)
WBC: 12 10*3/uL — ABNORMAL HIGH (ref 4.0–10.5)
nRBC: 0 % (ref 0.0–0.2)

## 2022-05-30 LAB — COMPREHENSIVE METABOLIC PANEL
ALT: 16 U/L (ref 0–44)
AST: 23 U/L (ref 15–41)
Albumin: 4.1 g/dL (ref 3.5–5.0)
Alkaline Phosphatase: 94 U/L (ref 38–126)
Anion gap: 10 (ref 5–15)
BUN: 8 mg/dL (ref 6–20)
CO2: 23 mmol/L (ref 22–32)
Calcium: 9.1 mg/dL (ref 8.9–10.3)
Chloride: 106 mmol/L (ref 98–111)
Creatinine, Ser: 0.7 mg/dL (ref 0.44–1.00)
GFR, Estimated: >60 mL/min (ref >60)
Glucose, Bld: 89 mg/dL (ref 70–99)
Potassium: 4.3 mmol/L (ref 3.5–5.1)
Sodium: 139 mmol/L (ref 135–145)
Total Bilirubin: 0.8 mg/dL (ref 0.3–1.2)
Total Protein: 7.4 g/dL (ref 6.5–8.1)

## 2022-05-30 IMAGING — MR MR LUMBAR SPINE W/O CM
4 of 5 series · 19 of 48 positions shown · non-contrast
Comparison: CT scan [DATE]

CLINICAL DATA: Low back pain

EXAM:
MRI LUMBAR SPINE WITHOUT CONTRAST
TECHNIQUE: Multiplanar, multisequence MR imaging of the lumbar spine was
performed. No intravenous contrast was administered.

[Series 2: T2 · sagittal · 4.0mm · 0.55mm/px · 6 of 17 slices shown (1 of 2)]
[im 1/17]
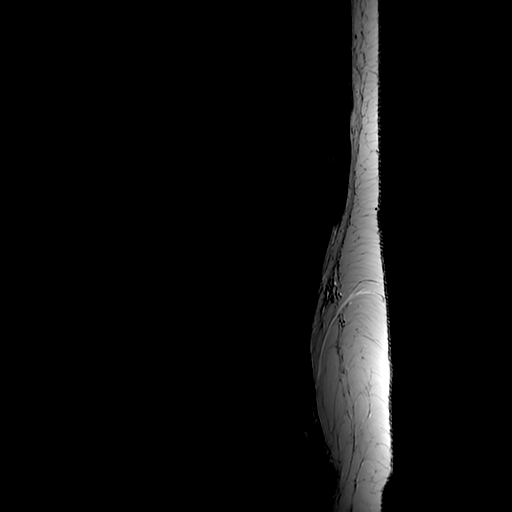
[im 4/17]
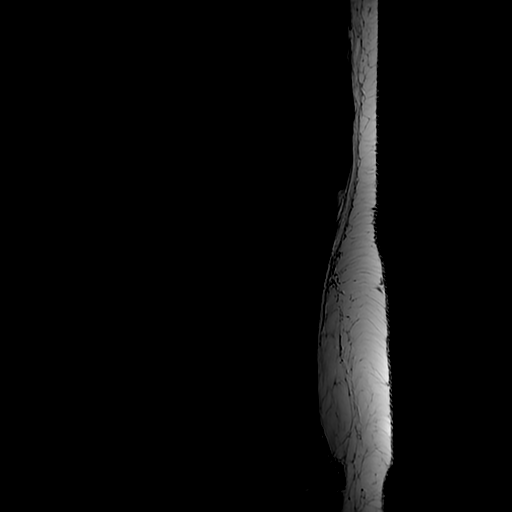
[im 7/17]
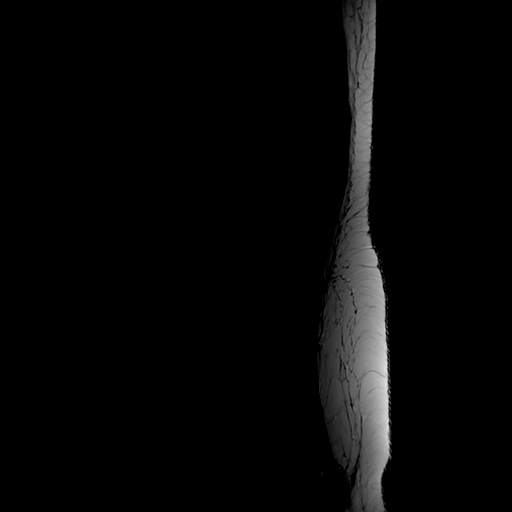
[im 10/17]
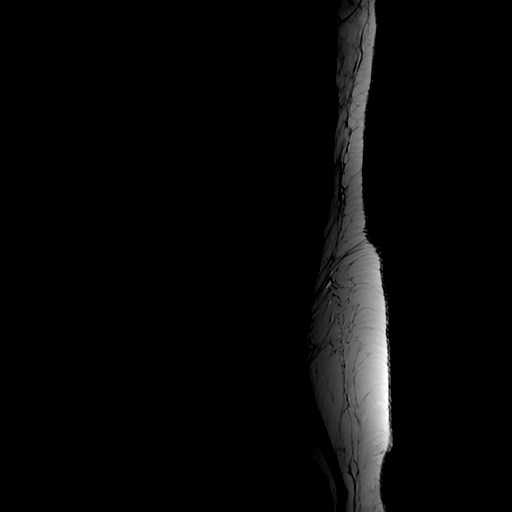
[im 13/17]
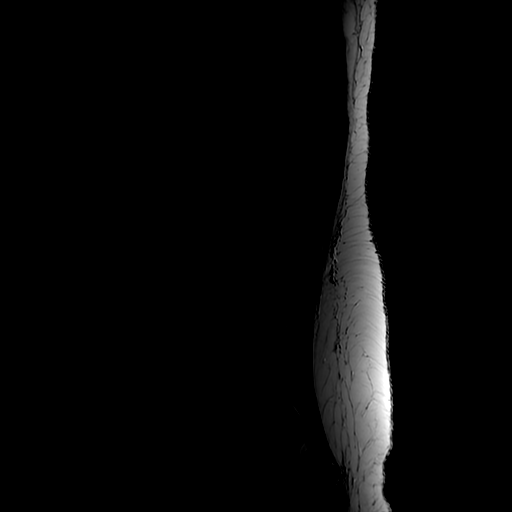
[im 17/17]
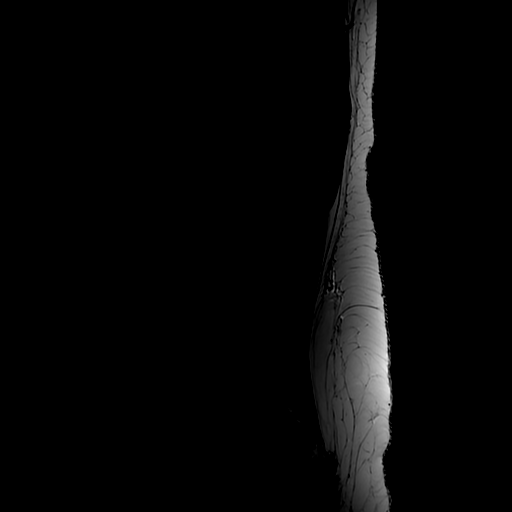

[Series 4: T1 · sagittal · 4.0mm · 0.51mm/px · 3 of 17 slices shown (1 of 2)]
[im 4/17]
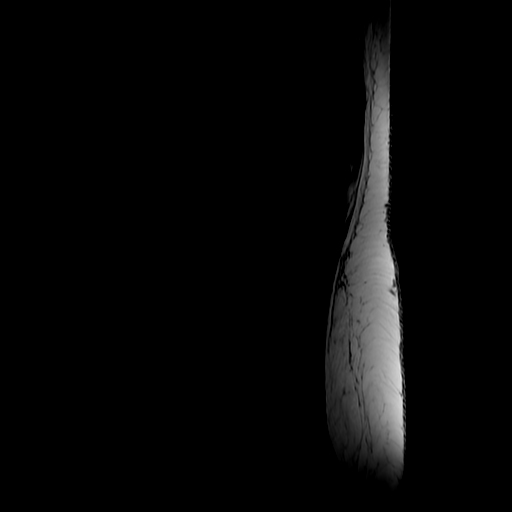
[im 10/17]
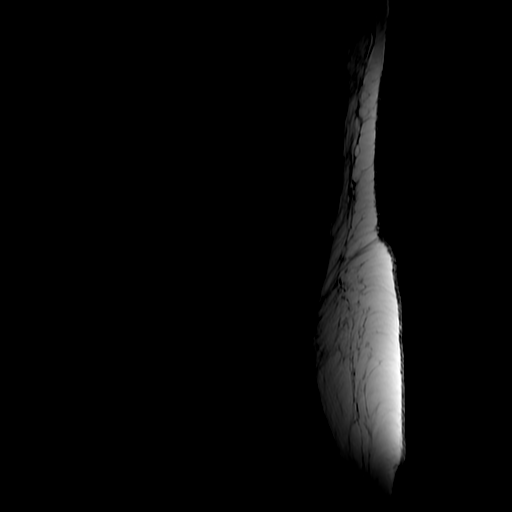
[im 17/17]
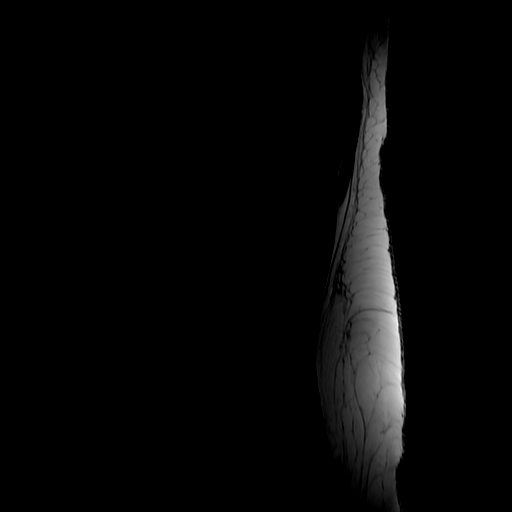

[Series 5: T2 · axial · 4.0mm · 0.39mm/px · z∈[-26,+158]mm · 7 of 45 slices shown (2 of 2)]
[im 1/45]
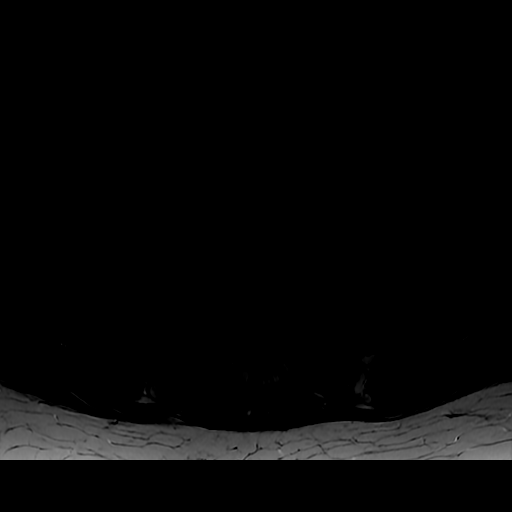
[im 7/45]
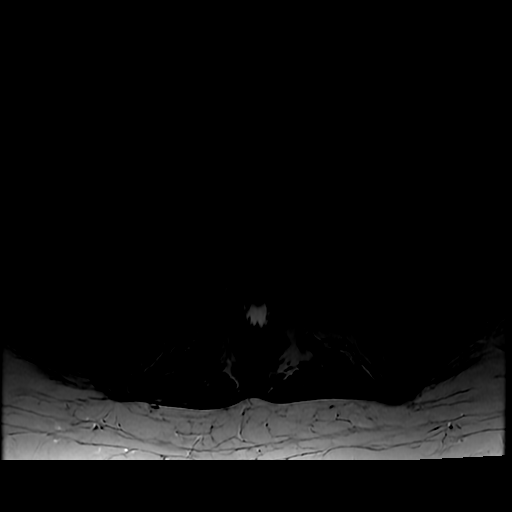
[im 13/45]
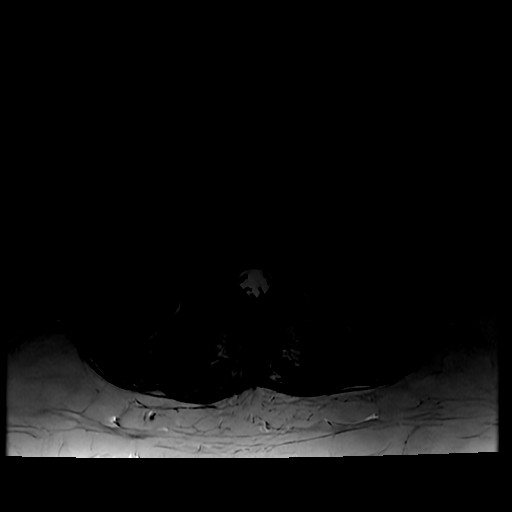
[im 19/45]
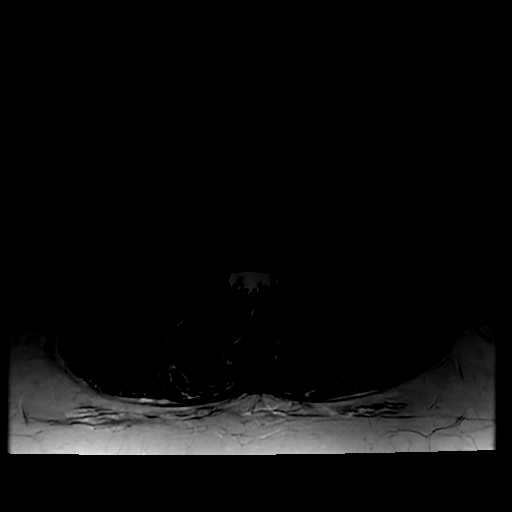
[im 23/45]
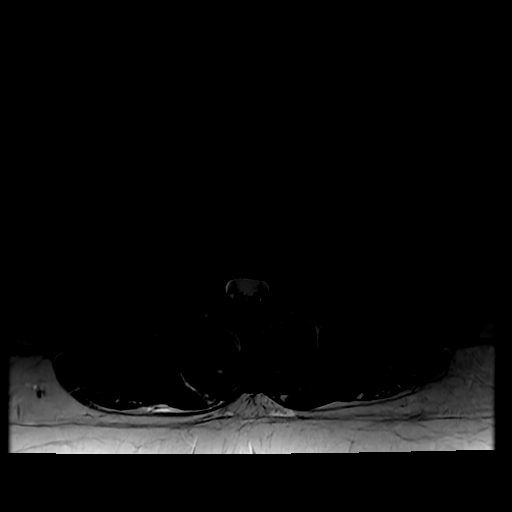
[im 26/45]
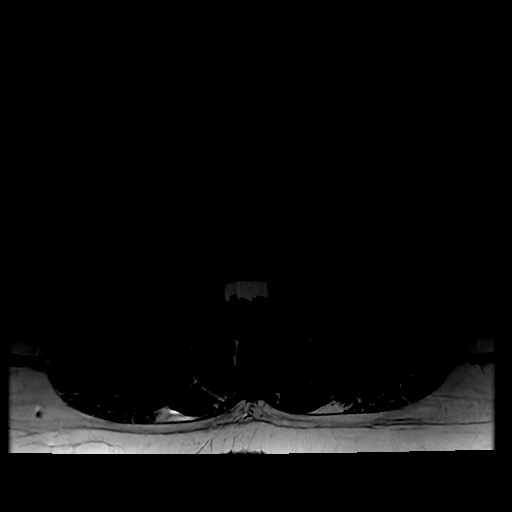
[im 38/45]
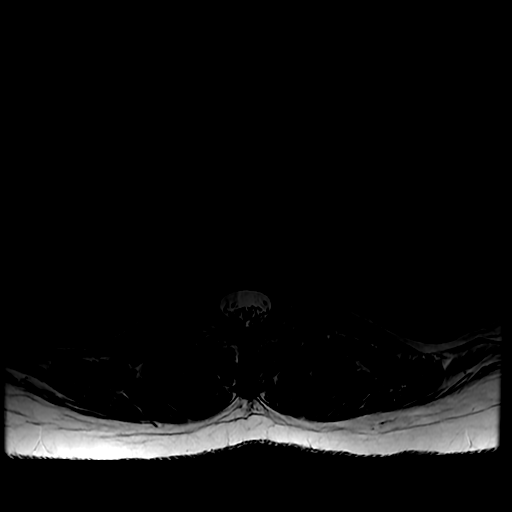

[Series 6: T1 · axial · 4.0mm · 0.39mm/px · z∈[+3,+158]mm · 3 of 45 slices shown (2 of 2)]
[im 7/45]
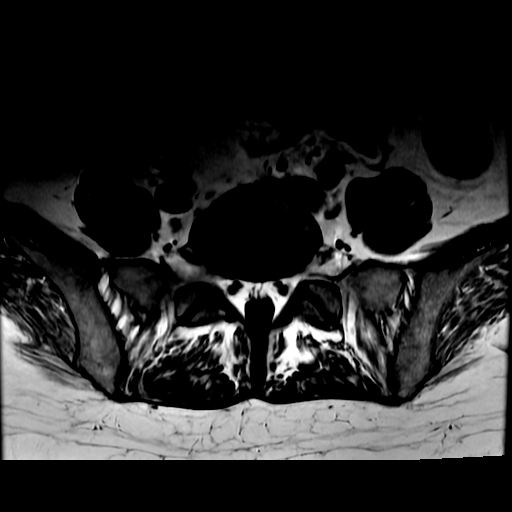
[im 23/45]
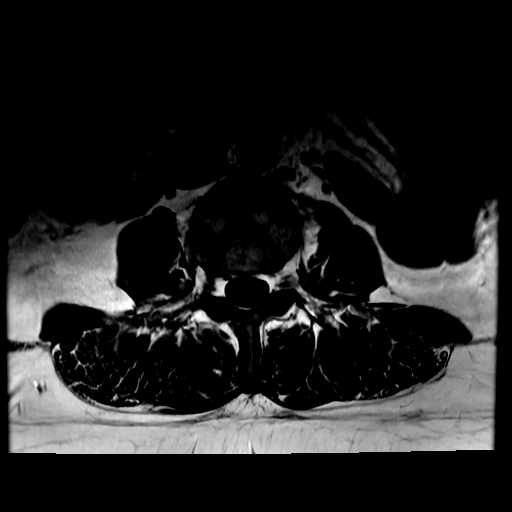
[im 38/45]
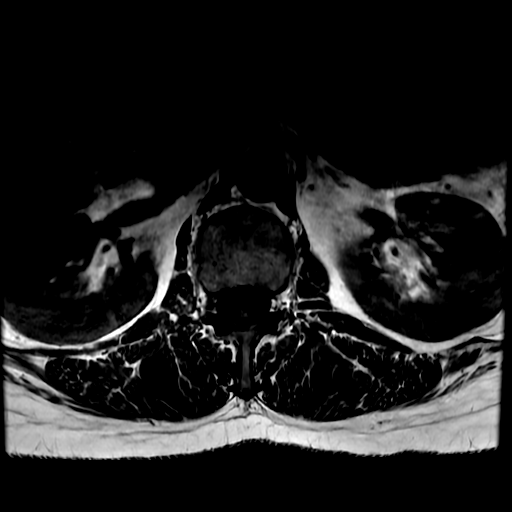

[19 of 48 positions shown; findings below may reference images not displayed]

FINDINGS: Despite efforts by the technologist and patient, motion artifact is
present on today's exam and could not be eliminated. This reduces
exam sensitivity and specificity.

Segmentation: The lowest lumbar type non-rib-bearing vertebra is
labeled as L5.

Alignment:  3 mm degenerative retrolisthesis at L4-5.

Vertebrae: Mild disc desiccation at L2-3, L3-4, and L4-5. No
significant vertebral marrow edema is identified.

Conus medullaris and cauda equina: Conus extends to the L1 level.
Conus and cauda equina appear normal.

Paraspinal and other soft tissues: Unremarkable

Disc levels:

T12-L1: Unremarkable.

L1-2: Unremarkable.

L2-3: Mild displacement of the right L2 nerve in the lateral
extraforaminal space due to a suspected right lateral extraforaminal
disc protrusion.

L3-4: Moderate left foraminal stenosis with mild left subarticular
lateral recess stenosis and mild displacement of the left L3 nerve
in the lateral extraforaminal space due to left foraminal and
lateral extraforaminal disc protrusion along with disc bulge.
Borderline central narrowing of the thecal sac. Small left facet
joint effusion.

L4-5: No impingement. Disc bulge with small left inferior foraminal
disc protrusion.

L5-S1: No impingement. Small synovial cyst posterior to the right
facet joint, not in a position to cause impingement.
IMPRESSION: 1. Lumbar degenerative disc disease causing moderate impingement at
L3-4 and mild impingement at L2-3, as detailed above.

## 2022-05-30 IMAGING — MR MR HEAD W/O CM
6 of 10 series · 27 of 48 positions shown · non-contrast
Comparison: None Available.

CLINICAL DATA: Dizziness

EXAM:
MRI HEAD WITHOUT CONTRAST
TECHNIQUE: Multiplanar, multiecho pulse sequences of the brain and surrounding
structures were obtained without intravenous contrast.

[Series 2: DWI · axial · 3.0mm · 0.94mm/px · z∈[-60,+87]mm · 8 of 100 slices shown (1 of 2)]
[im 1/100]
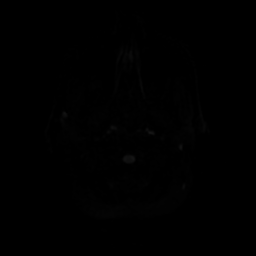
[im 12/100]
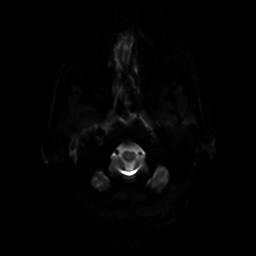
[im 34/100]
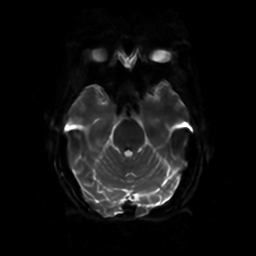
[im 45/100]
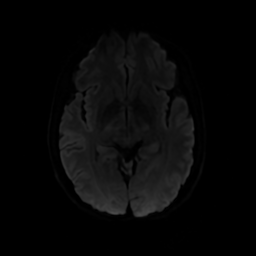
[im 56/100]
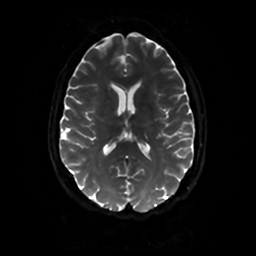
[im 67/100]
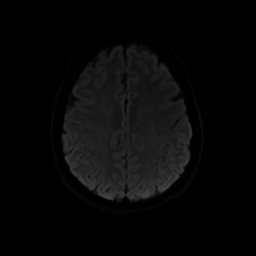
[im 89/100]
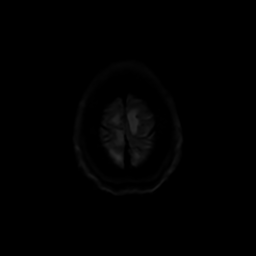
[im 100/100]
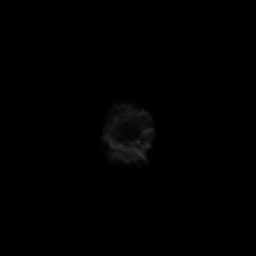

[Series 3: DWI · coronal · 4.0mm · 0.94mm/px · 7 of 74 slices shown (2 of 2)]
[im 1/74]
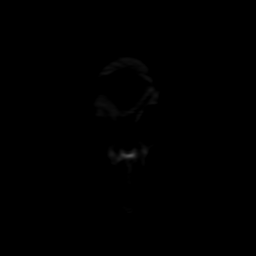
[im 13/74]
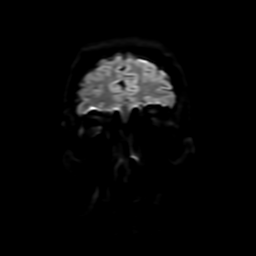
[im 25/74]
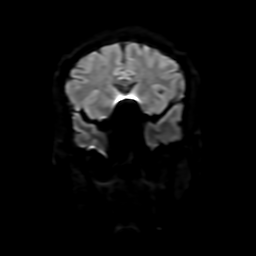
[im 37/74]
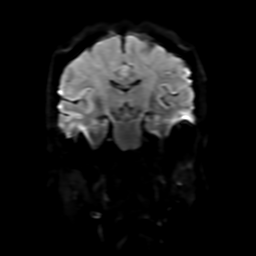
[im 49/74]
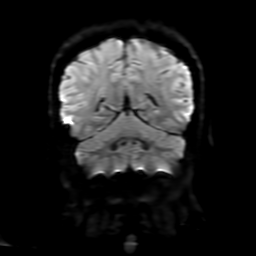
[im 61/74]
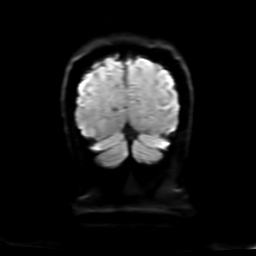
[im 74/74]
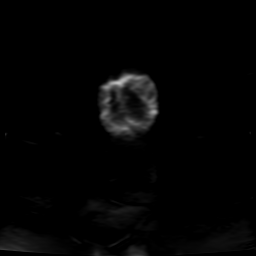

[Series 4: FLAIR · sagittal · 5.0mm · 0.23mm/px · 2 of 25 slices shown (1 of 2)]
[im 1/25]
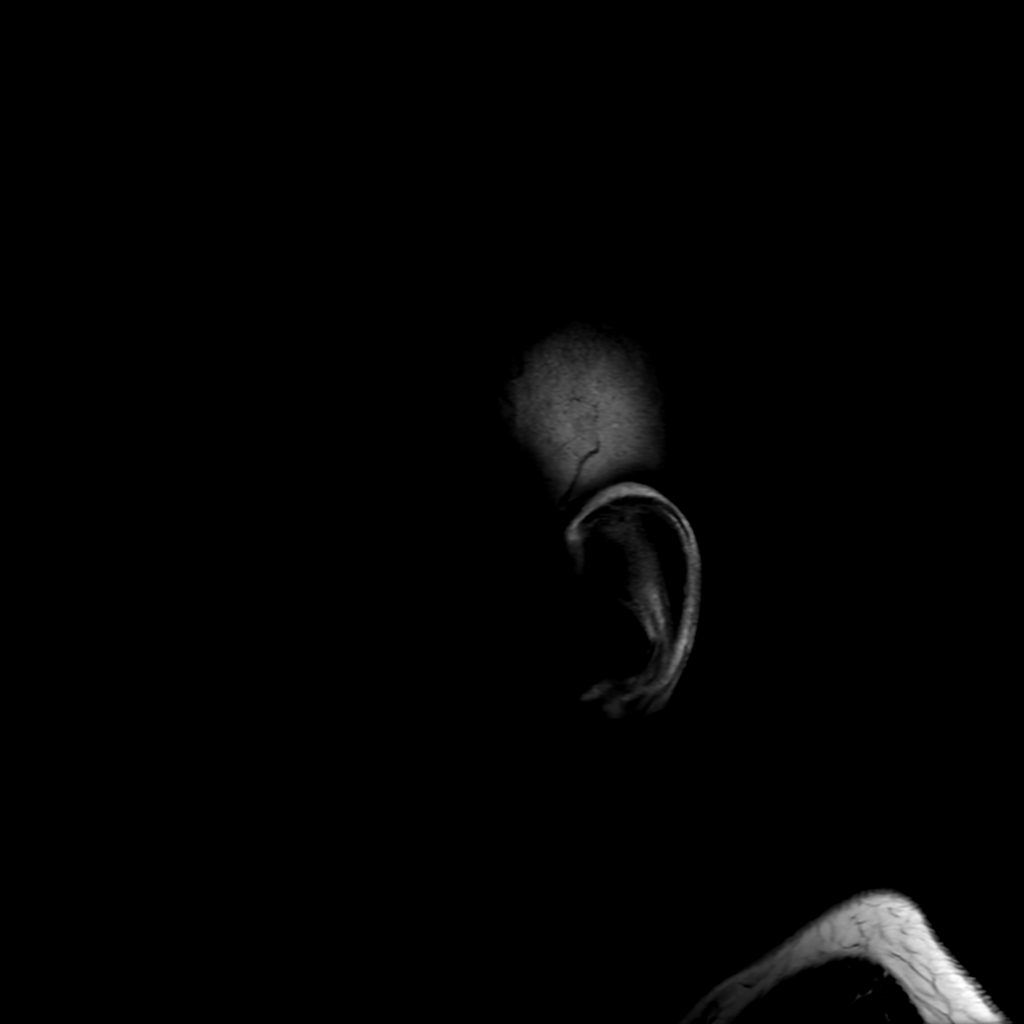
[im 25/25]
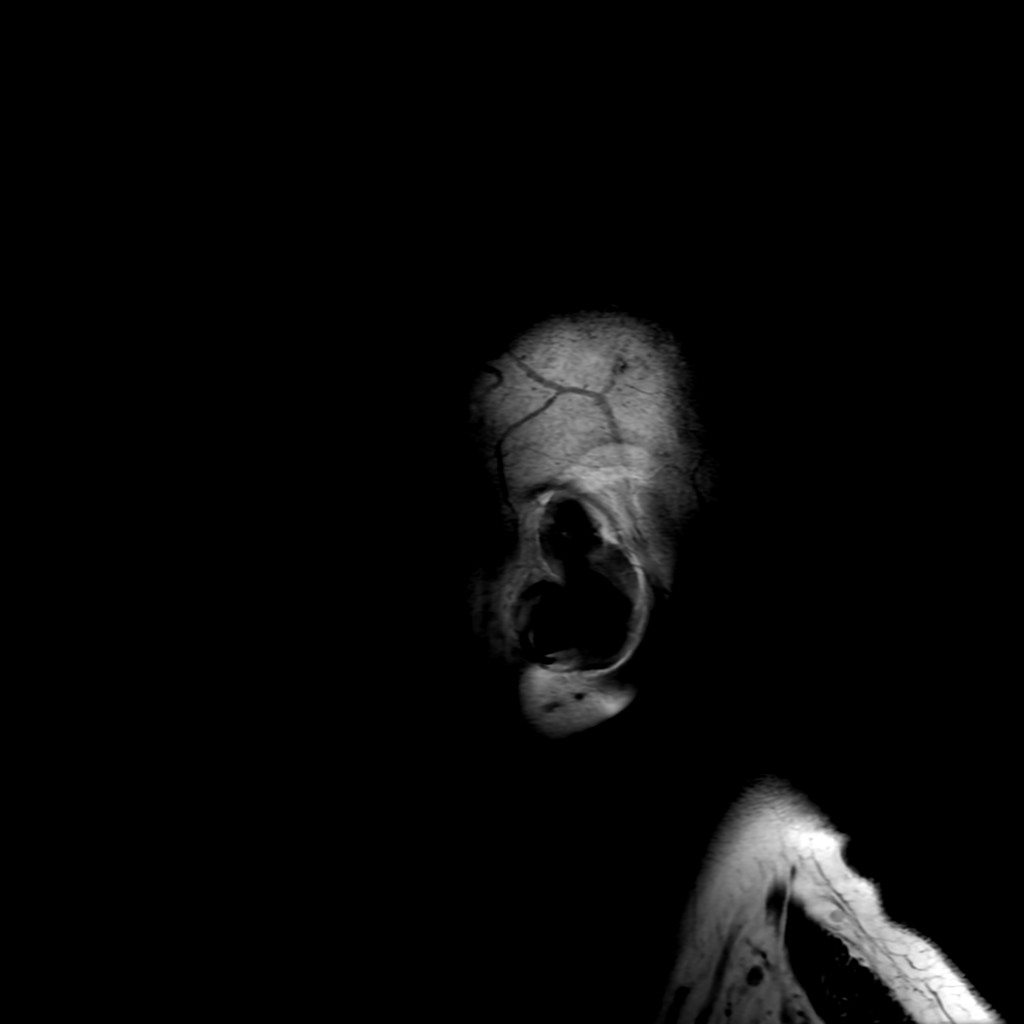

[Series 6: FLAIR · axial · 4.0mm · 0.45mm/px · z∈[-60,+94]mm · 3 of 36 slices shown (2 of 2)]
[im 1/36]
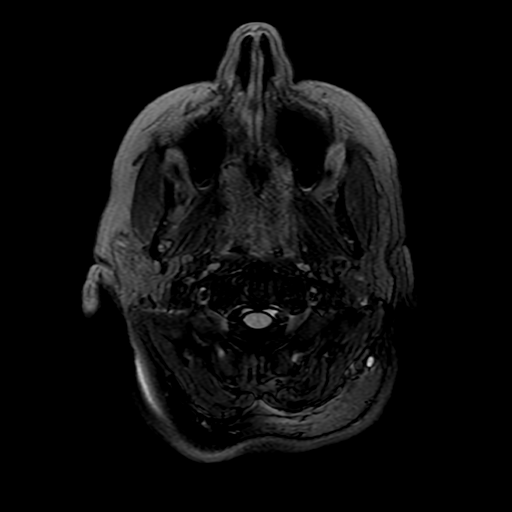
[im 18/36]
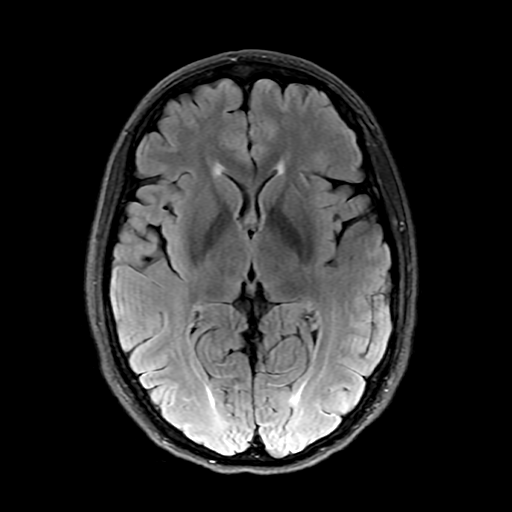
[im 36/36]
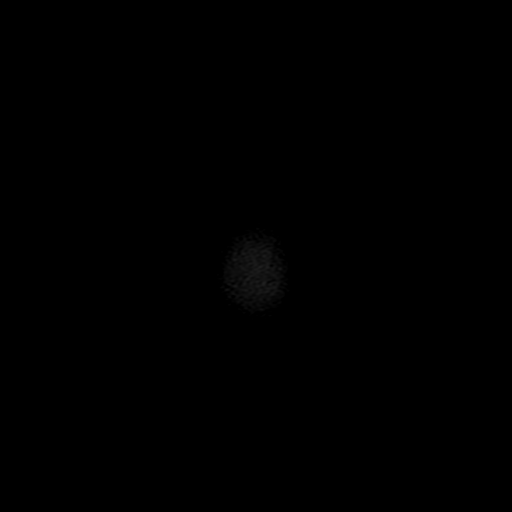

[Series 250: ADC · axial · 3.0mm · 0.94mm/px · z∈[-60,+87]mm · 4 of 50 slices shown (1 of 2)]
[im 1/50]
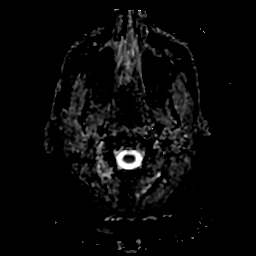
[im 17/50]
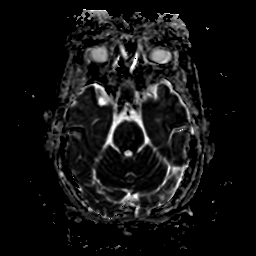
[im 33/50]
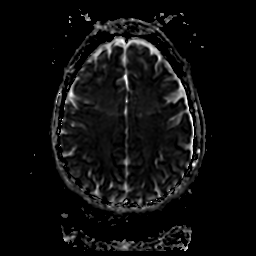
[im 50/50]
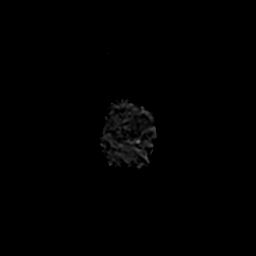

[Series 350: ADC · coronal · 4.0mm · 0.94mm/px · 3 of 34 slices shown (2 of 2)]
[im 1/34]
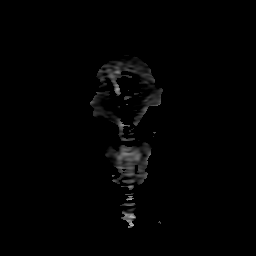
[im 17/34]
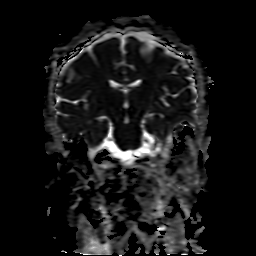
[im 34/34]
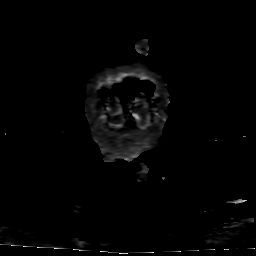

[27 of 48 positions shown; findings below may reference images not displayed]

FINDINGS: Brain: No acute infarct, mass effect or extra-axial collection. No
acute or chronic hemorrhage. Normal white matter signal, parenchymal
volume and CSF spaces. The midline structures are normal.

Vascular: Major flow voids are preserved.

Skull and upper cervical spine: Normal calvarium and skull base.
Visualized upper cervical spine and soft tissues are normal.

Sinuses/Orbits:No paranasal sinus fluid levels or advanced mucosal
thickening. No mastoid or middle ear effusion. Normal orbits.
IMPRESSION: Normal brain MRI.

## 2022-05-30 IMAGING — MR MR CERVICAL SPINE W/O CM
4 of 5 series · 19 of 48 positions shown · non-contrast
Comparison: None Available.

CLINICAL DATA: Acute neck pain

EXAM:
MRI CERVICAL SPINE WITHOUT CONTRAST
TECHNIQUE: Multiplanar, multisequence MR imaging of the cervical spine was
performed. No intravenous contrast was administered.

[Series 2: T2 · sagittal · 3.0mm · 0.43mm/px · 4 of 17 slices shown (1 of 2)]
[im 1/17]
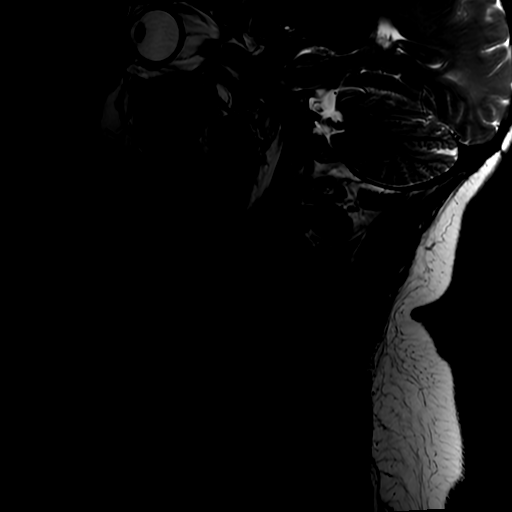
[im 6/17]
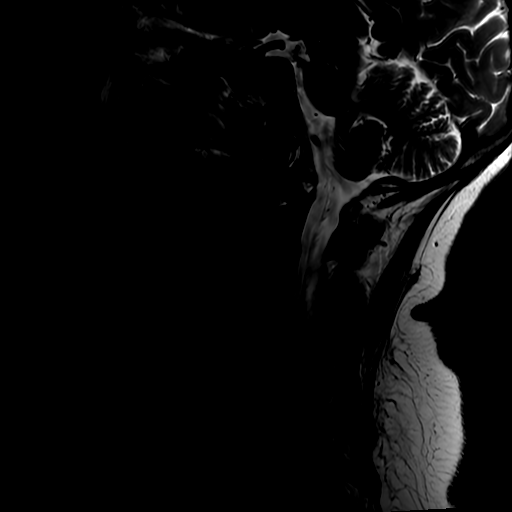
[im 11/17]
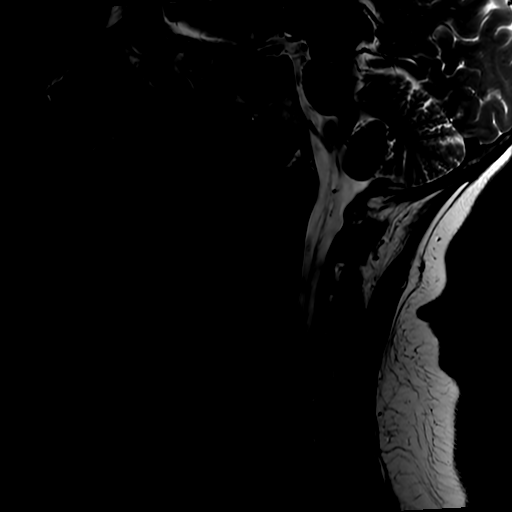
[im 17/17]
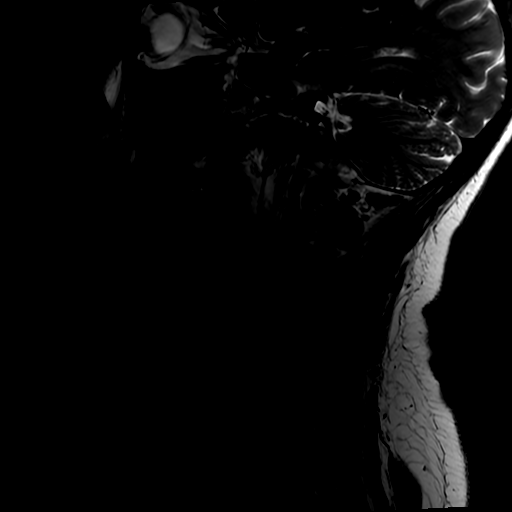

[Series 3: FLAIR · sagittal · 3.0mm · 0.43mm/px · 3 of 17 slices shown]
[im 1/17]
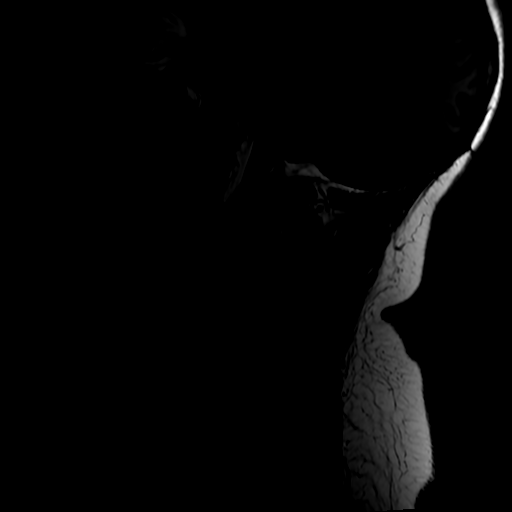
[im 11/17]
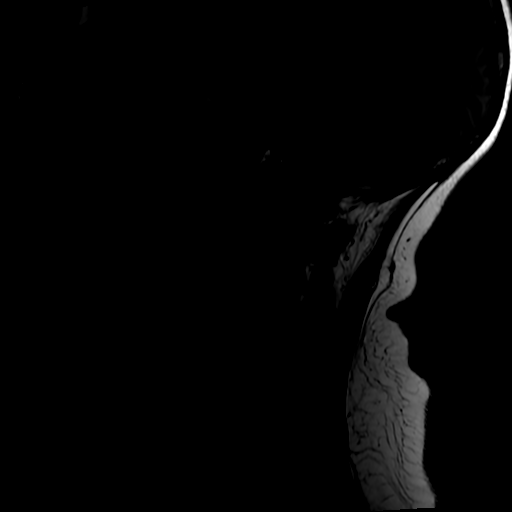
[im 17/17]
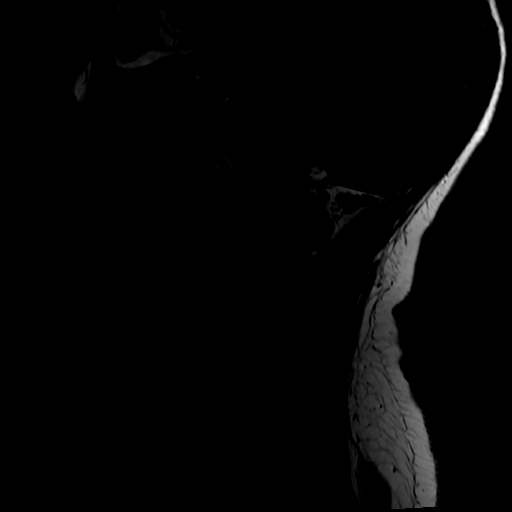

[Series 4: STIR · sagittal · 3.0mm · 0.43mm/px · 3 of 17 slices shown]
[im 1/17]
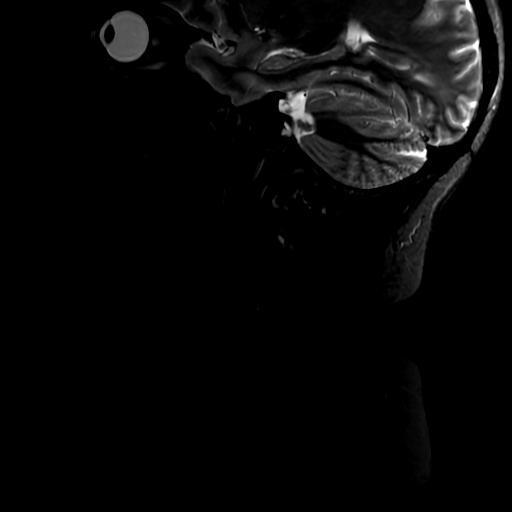
[im 9/17]
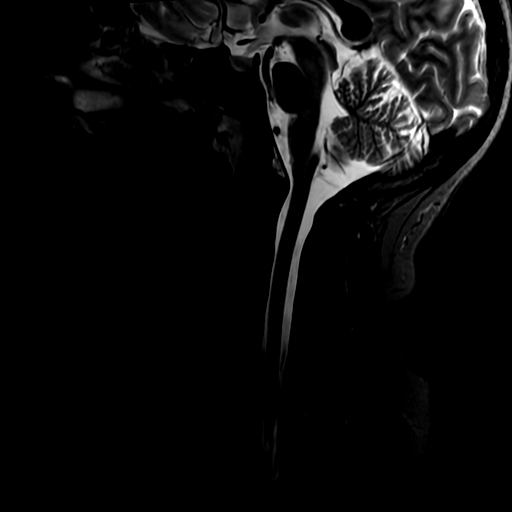
[im 17/17]
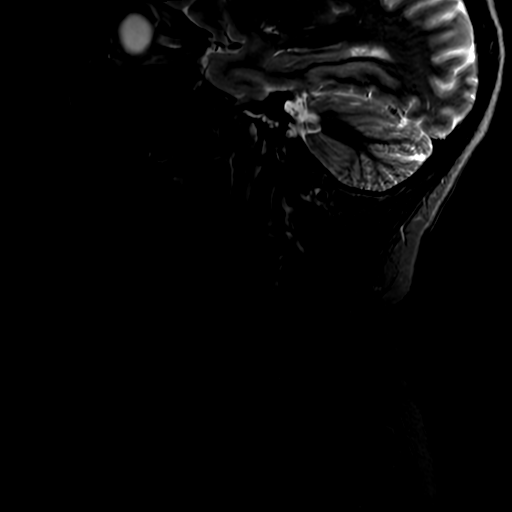

[Series 5: T2 · axial · 3.0mm · 0.35mm/px · z∈[-197,-91]mm · 9 of 33 slices shown (2 of 2)]
[im 1/33]
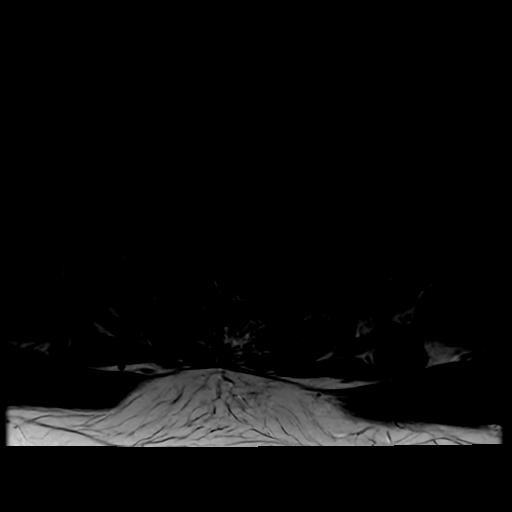
[im 5/33]
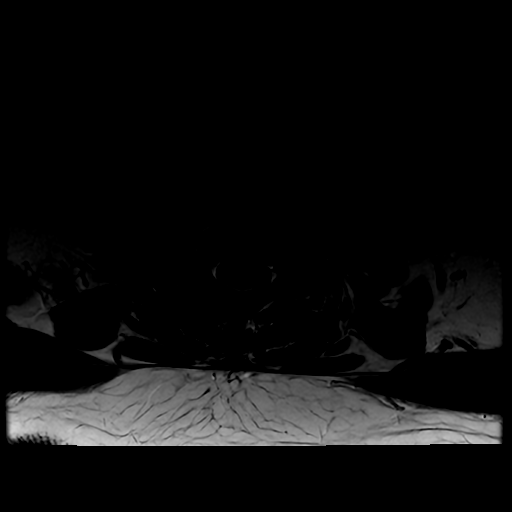
[im 9/33]
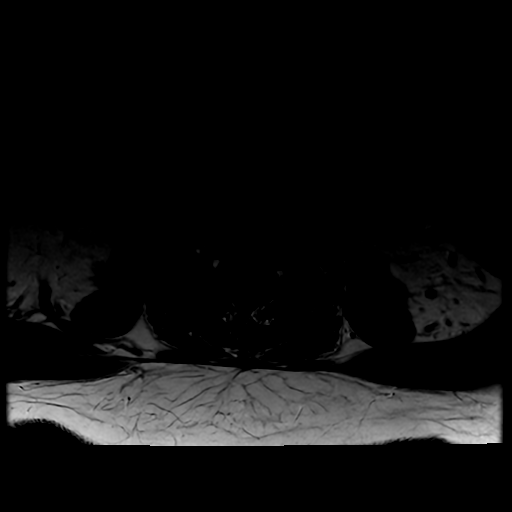
[im 13/33]
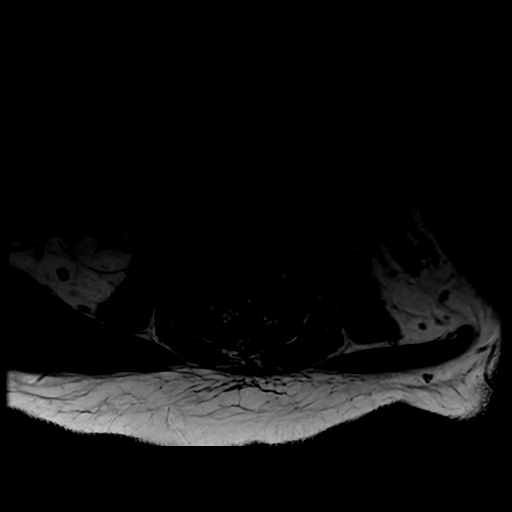
[im 17/33]
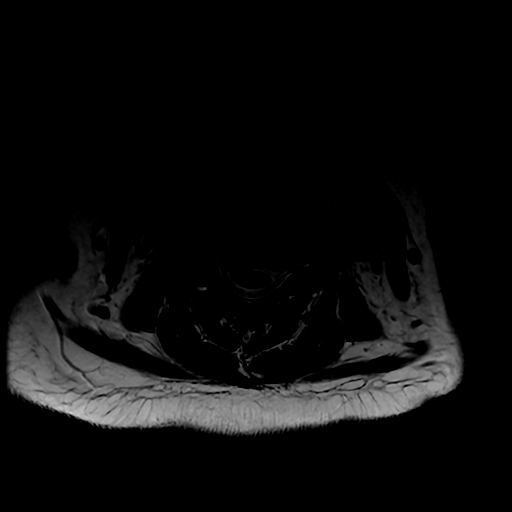
[im 21/33]
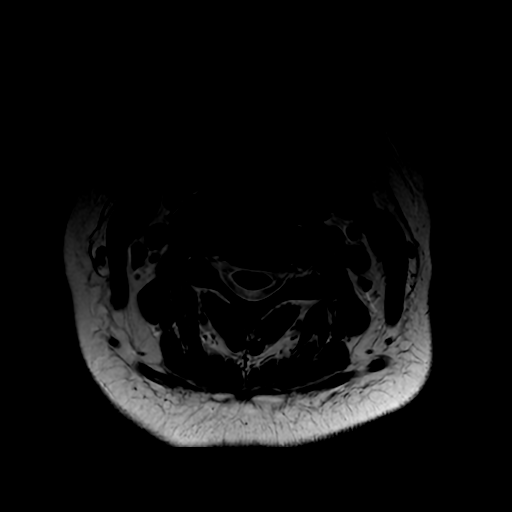
[im 25/33]
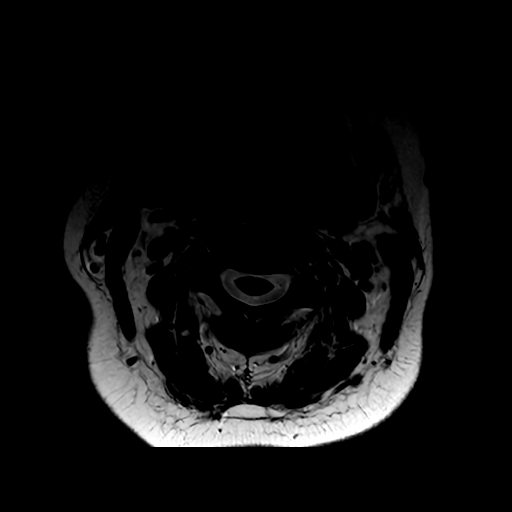
[im 29/33]
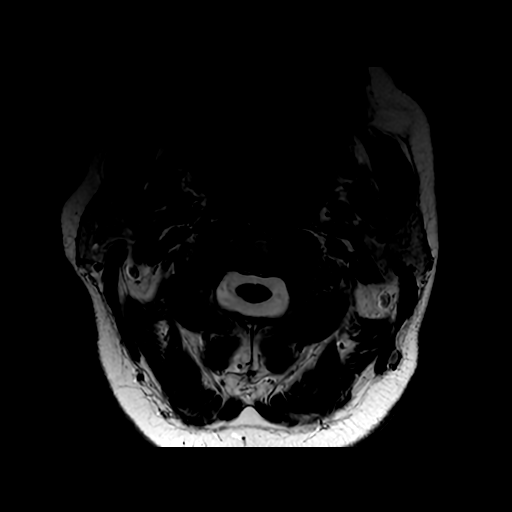
[im 33/33]
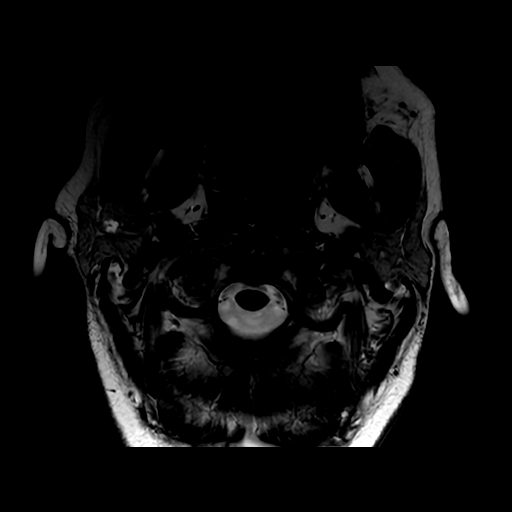

[19 of 48 positions shown; findings below may reference images not displayed]

FINDINGS: Alignment: Physiologic.

Vertebrae: No fracture, evidence of discitis, or bone lesion.

Cord: Normal signal and morphology.

Posterior Fossa, vertebral arteries, paraspinal tissues: Negative.

Disc levels:

C1-2: Unremarkable.

C2-3: Normal disc space and facet joints. There is no spinal canal
stenosis. No neural foraminal stenosis.

C3-4: Normal disc space and facet joints. There is no spinal canal
stenosis. No neural foraminal stenosis.

C4-5: Normal disc space and facet joints. There is no spinal canal
stenosis. No neural foraminal stenosis.

C5-6: Decreased size of left asymmetric disc bulge. There is no
spinal canal stenosis. Unchanged mild left neural foraminal
stenosis.

C6-7: Small left asymmetric disc bulge is unchanged. There is no
spinal canal stenosis. Unchanged mild left neural foraminal
stenosis.

C7-T1: Normal disc space and facet joints. There is no spinal canal
stenosis. No neural foraminal stenosis.
IMPRESSION: 1. No acute abnormality of the cervical spine.
2. Unchanged mild left neural foraminal stenosis at C5-6 and C6-7.

## 2022-05-30 MED ORDER — DEXAMETHASONE SODIUM PHOSPHATE 10 MG/ML IJ SOLN
10.0000 mg | Freq: Once | INTRAMUSCULAR | Status: AC
Start: 2022-05-30 — End: 2022-05-30
  Administered 2022-05-30: 10 mg via INTRAVENOUS
  Filled 2022-05-30: qty 1

## 2022-05-30 MED ORDER — HYDROMORPHONE HCL 1 MG/ML IJ SOLN
0.5000 mg | Freq: Once | INTRAMUSCULAR | Status: AC
  Administered 2022-05-30: 0.5 mg via INTRAVENOUS
  Filled 2022-05-30: qty 1

## 2022-05-30 MED ORDER — SODIUM CHLORIDE 0.9 % IV BOLUS
500.0000 mL | Freq: Once | INTRAVENOUS | Status: AC
  Administered 2022-05-30: 500 mL via INTRAVENOUS

## 2022-05-30 MED ORDER — PREDNISONE 10 MG (21) PO TBPK: ORAL_TABLET | Freq: Every day | ORAL | 0 refills | Status: AC

## 2022-05-30 MED ORDER — LORAZEPAM 2 MG/ML IJ SOLN
1.0000 mg | Freq: Once | INTRAMUSCULAR | Status: AC
  Administered 2022-05-30: 1 mg via INTRAVENOUS
  Filled 2022-05-30: qty 1

## 2022-05-30 NOTE — ED Provider Notes (Signed)
[facility] EMERGENCY DEPARTMENT Provider Note   CSN: [id number] Arrival date & time: 05/30/22  1451     History  Chief Complaint  Patient presents with   Back Pain    Ebony Galvan is a 51 y.o. female.  HPI  Patient is a 51 year old female with a history of osteoarthritis, neck pain, anxiety, who presents to the emergency department due to low back pain.  Symptoms started 4 days ago.  States that she was initially experiencing saddle anesthesia which has persisted.  She then began experiencing pain diffusely in the lumbar back.  This progressively worsened over the last 3 days.  She now notes any difficulty with movement due to the severity of her pain.  States that she has not had any bowel/bladder incontinence, urinary retention, though she does note that she has not had a bowel movement in 3 days and also notes difficulty with flatulence.  Denies any numbness or radiation of pain down the legs.  Also notes intermittent dizziness for the past day.  Worsens when moving from a sitting to a standing position as well as with head movement.  Per records, patient initially seen in the [facility] emergency department earlier today and there was concern for cauda equina.  Her case was discussed with Dr. Danielle Dess with neurosurgery who accepted the patient for transfer to our emergency department.  They recommended MRI of the cervical and lumbar spine.     Home Medications Prior to Admission medications   Medication Sig Start Date End Date Taking? Authorizing Provider  predniSONE (STERAPRED UNI-PAK 21 TAB) 10 MG (21) TBPK tablet Take by mouth daily. Take 6 tabs by mouth daily  for 2 days, then 5 tabs for 2 days, then 4 tabs for 2 days, then 3 tabs for 2 days, 2 tabs for 2 days, then 1 tab by mouth daily for 2 days 05/30/22  Yes Placido Sou, PA-C  Aspirin-Salicylamide-Caffeine (BC HEADACHE POWDER PO) Take 1 packet by mouth once.    [provider]  diclofenac (VOLTAREN)  75 MG EC tablet Take 1 tablet (75 mg total) by mouth 2 (two) times daily. 08/25/16   Asencion Islam, DPM  escitalopram (LEXAPRO) 10 MG tablet 20 mg.    [provider]  escitalopram (LEXAPRO) 20 MG tablet Take 20 mg by mouth.    [provider]  ibuprofen (ADVIL,MOTRIN) 800 MG tablet Take 800 mg by mouth. 09/11/15   [provider]  LORazepam (ATIVAN) 1 MG tablet Take 1 mg by mouth.    [provider]  meclizine (ANTIVERT) 25 MG tablet Take 1 tablet (25 mg total) by mouth 3 (three) times daily as needed. 05/01/13   Muthersbaugh, Dahlia Client, PA-C  meloxicam (MOBIC) 15 MG tablet Take 1 tablet (15 mg total) by mouth daily. 09/06/16   Asencion Islam, DPM  oxyCODONE (OXY IR/ROXICODONE) 5 MG immediate release tablet Take 5 mg by mouth.    [provider]  silver sulfADIAZINE (SILVADENE) 1 % cream Apply topically. 04/12/16   [provider]     Allergies    Tramadol    Review of Systems   Review of Systems  All other systems reviewed and are negative. Ten systems reviewed and are negative for acute change, except as noted in the HPI.   Physical Exam Updated Vital Signs BP 120/60   Pulse 82   Resp 16   Ht  (1.702 m)   Wt 87.5 kg   SpO2 95%   BMI  30.23 kg/m  Physical Exam Vitals and nursing note reviewed.  Constitutional:      General: She is not in acute distress.    Appearance: Normal appearance. She is not ill-appearing, toxic-appearing or diaphoretic.  HENT:     Head: Normocephalic and atraumatic.     Right Ear: External ear normal.     Left Ear: External ear normal.     Nose: Nose normal.     Mouth/Throat:     Mouth: Mucous membranes are moist.     Pharynx: Oropharynx is clear. No oropharyngeal exudate or posterior oropharyngeal erythema.  Eyes:     General: No scleral icterus.       Right eye: No discharge.        Left eye: No discharge.     Extraocular Movements: Extraocular movements intact.     Conjunctiva/sclera:  Conjunctivae normal.     Pupils: Pupils are equal, round, and reactive to light.  Cardiovascular:     Rate and Rhythm: Normal rate and regular rhythm.     Pulses: Normal pulses.     Heart sounds: Normal heart sounds. No murmur heard.   No friction rub. No gallop.  Pulmonary:     Effort: Pulmonary effort is normal. No respiratory distress.     Breath sounds: Normal breath sounds. No stridor. No wheezing, rhonchi or rales.  Abdominal:     General: Abdomen is flat.     Palpations: Abdomen is soft.     Tenderness: There is no abdominal tenderness.     Comments: Abdomen is soft and nontender.  Musculoskeletal:        General: Tenderness present. Normal range of motion.     Cervical back: Normal range of motion and neck supple. No tenderness.     Comments: Moderate TTP noted along the midline lumbar spine as well as the left lumbar paraspinal musculature.  No step-offs, crepitus, or deformities.  Skin:    General: Skin is warm and dry.  Neurological:     General: No focal deficit present.     Mental Status: She is alert and oriented to person, place, and time.     Comments: A&O x3.  Speaking clearly and coherently.  Strength is 5/5 in the bilateral upper extremities.  Strength is 5/5 with plantar/dorsiflexion of the bilateral feet.  Patient is unable to lift her legs off the bed.  Distal sensation intact.  Palpable pedal pulses.  Psychiatric:        Mood and Affect: Mood normal.        Behavior: Behavior normal.   ED Results / Procedures / Treatments   Labs (all labs ordered are listed, but only abnormal results are displayed) Labs Reviewed  CBC WITH DIFFERENTIAL/PLATELET - Abnormal; Notable for the following components:      Result Value   WBC 12.0 (*)    Hemoglobin 15.3 (*)    Neutro Abs 7.9 (*)    All other components within normal limits  COMPREHENSIVE METABOLIC PANEL  I-STAT BETA HCG BLOOD, ED ([facility], [facility], [facility] ONLY)   EKG None  Radiology MR BRAIN WO CONTRAST  Result Date:  05/30/2022 CLINICAL DATA:  Dizziness EXAM: MRI HEAD WITHOUT CONTRAST TECHNIQUE: Multiplanar, multiecho pulse sequences of the brain and surrounding structures were obtained without intravenous contrast. COMPARISON:  None Available. FINDINGS: Brain: No acute infarct, mass effect or extra-axial collection. No acute or chronic hemorrhage. Normal white matter signal, parenchymal volume and CSF spaces. The midline structures are normal. Vascular: Major flow voids  are preserved. Skull and upper cervical spine: Normal calvarium and skull base. Visualized upper cervical spine and soft tissues are normal. Sinuses/Orbits:No paranasal sinus fluid levels or advanced mucosal thickening. No mastoid or middle ear effusion. Normal orbits. IMPRESSION: Normal brain MRI. Electronically Signed   By: Deatra Robinson M.D.   On: 05/30/2022 22:10   MR Cervical Spine Wo Contrast  Result Date: 05/30/2022 CLINICAL DATA:  Acute neck pain EXAM: MRI CERVICAL SPINE WITHOUT CONTRAST TECHNIQUE: Multiplanar, multisequence MR imaging of the cervical spine was performed. No intravenous contrast was administered. COMPARISON:  None Available. FINDINGS: Alignment: Physiologic. Vertebrae: No fracture, evidence of discitis, or bone lesion. Cord: Normal signal and morphology. Posterior Fossa, vertebral arteries, paraspinal tissues: Negative. Disc levels: C1-2: Unremarkable. C2-3: Normal disc space and facet joints. There is no spinal canal stenosis. No neural foraminal stenosis. C3-4: Normal disc space and facet joints. There is no spinal canal stenosis. No neural foraminal stenosis. C4-5: Normal disc space and facet joints. There is no spinal canal stenosis. No neural foraminal stenosis. C5-6: Decreased size of left asymmetric disc bulge. There is no spinal canal stenosis. Unchanged mild left neural foraminal stenosis. C6-7: Small left asymmetric disc bulge is unchanged. There is no spinal canal stenosis. Unchanged mild left neural foraminal stenosis.  C7-T1: Normal disc space and facet joints. There is no spinal canal stenosis. No neural foraminal stenosis. IMPRESSION: 1. No acute abnormality of the cervical spine. 2. Unchanged mild left neural foraminal stenosis at C5-6 and C6-7. Electronically Signed   By: Deatra Robinson M.D.   On: 05/30/2022 22:33   MR LUMBAR SPINE WO CONTRAST  Result Date: 05/30/2022 CLINICAL DATA:  Low back pain EXAM: MRI LUMBAR SPINE WITHOUT CONTRAST TECHNIQUE: Multiplanar, multisequence MR imaging of the lumbar spine was performed. No intravenous contrast was administered. COMPARISON:  CT scan 11/02/2020 FINDINGS: Despite efforts by the technologist and patient, motion artifact is present on today's exam and could not be eliminated. This reduces exam sensitivity and specificity. Segmentation: The lowest lumbar type non-rib-bearing vertebra is labeled as L5. Alignment:  3 mm degenerative retrolisthesis at L4-5. Vertebrae: Mild disc desiccation at L2-3, L3-4, and L4-5. No significant vertebral marrow edema is identified. Conus medullaris and cauda equina: Conus extends to the L1 level. Conus and cauda equina appear normal. Paraspinal and other soft tissues: Unremarkable Disc levels: T12-L1: Unremarkable. L1-2: Unremarkable. L2-3: Mild displacement of the right L2 nerve in the lateral extraforaminal space due to a suspected right lateral extraforaminal disc protrusion. L3-4: Moderate left foraminal stenosis with mild left subarticular lateral recess stenosis and mild displacement of the left L3 nerve in the lateral extraforaminal space due to left foraminal and lateral extraforaminal disc protrusion along with disc bulge. Borderline central narrowing of the thecal sac. Small left facet joint effusion. L4-5: No impingement. Disc bulge with small left inferior foraminal disc protrusion. L5-S1: No impingement. Small synovial cyst posterior to the right facet joint, not in a position to cause impingement. IMPRESSION: 1. Lumbar degenerative disc  disease causing moderate impingement at L3-4 and mild impingement at L2-3, as detailed above. Electronically Signed   By: Gaylyn Rong M.D.   On: 05/30/2022 21:42    Procedures Procedures   Medications Ordered in ED Medications  sodium chloride 0.9 % bolus 500 mL (0 mLs Intravenous Stopped 05/30/22 1740)  dexamethasone (DECADRON) injection 10 mg (10 mg Intravenous Given 05/30/22 1628)  HYDROmorphone (DILAUDID) injection 0.5 mg (0.5 mg Intravenous Given 05/30/22 1628)  LORazepam (ATIVAN) injection 1 mg (1 mg Intravenous  Given 05/30/22 2040)   ED Course/ Medical Decision Making/ A&P                           Medical Decision Making Amount and/or Complexity of Data Reviewed Labs: ordered. Radiology: ordered.  Risk Prescription drug management.   Pt is a 51 y.o. female who presents from the [facility] emergency department for cauda equina rule out.  Please see HPI above for additional information.  Labs: CBC with a white count of 12, hemoglobin of 15.3, neutrophils of 7.9. CMP without abnormalities.  Imaging: MRI of the brain shows normal brain MRI.  MRI of the cervical spine without contrast shows no acute abnormality of the cervical spine.  Unchanged mild left neural for minimal stenosis at C5-6 and C6-7.  MRI of the lumbar spine shows lumbar degenerative disc disease causing moderate impingement at L3-4 and mild impingement at L2-3 as detailed above.  I, Placido Sou, PA-C, personally reviewed and evaluated these images and lab results as part of my medical decision-making.  On my exam heart is regular rate and rhythm without murmurs, rubs, or gallops.  Lungs are clear to auscultation bilaterally.  Abdomen is soft and nontender.  Patient has moderate tenderness along the midline lumbar spine as well as the left-sided lumbar paraspinal musculature.  Strength is 5/5 with plantar/dorsiflexion of both feet.  Distal sensation intact.  Palpable pedal pulses.  Patient unable to lift her  legs off the bed.  She was initially discussed with Dr. Danielle Dess with neurosurgery by the emergency medicine team at [facility] emergency department.  They recommended MRI of the cervical and lumbar spine.  These were obtained with findings as noted above.  Appears reassuring.  Based on patient's physical exam findings as well as the results of her imaging, does not appear consistent with cauda equina at this time.  Patient noting positional dizziness as well so additional MRI was obtained of the brain which appears reassuring as well.  On reassessment patient is lying comfortably in bed.  Appears stable for discharge at this time.  Will discharge on a course of prednisone.  She denies a history of diabetes mellitus.  Recommended PCP follow-up.  Her questions were answered and she was amicable at the time of discharge.  Note: Portions of this report may have been transcribed using voice recognition software. Every effort was made to ensure accuracy; however, inadvertent computerized transcription errors may be present.   Final Clinical Impression(s) / ED Diagnoses Final diagnoses:  Acute midline low back pain without sciatica   Rx / DC Orders ED Discharge Orders          Ordered    predniSONE (STERAPRED UNI-PAK 21 TAB) 10 MG (21) TBPK tablet  Daily        05/30/22 2247              Placido Sou, PA-C 05/30/22 2324    Franne Forts, DO 05/31/22 1257

## 2022-05-30 NOTE — ED Notes (Signed)
Pt off unit to MRI.

## 2022-05-30 NOTE — Discharge Instructions (Signed)
I am prescribing you a strong steroid medication called prednisone.  Please only take this as prescribed.  Please take it early in the morning, as this medication can be stimulating and make it difficult to sleep at night.  If you develop any new or worsening symptoms please do not hesitate to return to the emergency department. Please follow-up with your primary care provider regarding your symptoms as well as this visit today.

## 2022-05-30 NOTE — ED Notes (Signed)
Assisted pt to restroom, pt denies any other needs at this time

## 2022-05-30 NOTE — ED Triage Notes (Signed)
Lower Back pain   [place] suspected  lower back pain Pain score 10/10 initially  currently an 8/10 after been given gabapentin   Pt reports last bowel movement and meal was Thursday, however she is able to urinate  allergic to tramadol   Pt hx  - C section  - anxiety  - depression   Last Vitals BP: 126/70 HR: 86  RR 18   spO2: 98   GCS 15

## 2022-05-30 NOTE — ED Notes (Signed)
Pt upset about delay stating "the pt got here after me and is going to [facility] before me" apologized for the delay and explained to pt that [facility] makes their list based on many different factors

## 2022-05-30 NOTE — ED Notes (Signed)
Offered pt snacks pt declined, asked pt if she needed to speak with social work about setting up a ride pt declined. Pt stated she just wanted to leave because she felt this hospital did not care about her, asked pt what I could do to help and pt stated nothing.   Pts family that was here earlier stated they would be able to come back to get her when she was ready.
